# Patient Record
Sex: Male | Born: 1985
Health system: Southern US, Community
[De-identification: ages and names within clinical notes are randomized; demographics above are authoritative.]

## PROBLEM LIST (undated history)

## (undated) DIAGNOSIS — E7800 Pure hypercholesterolemia, unspecified: Secondary | ICD-10-CM

---

## 2012-02-21 ENCOUNTER — Encounter (HOSPITAL_COMMUNITY): Payer: Self-pay

## 2012-02-21 ENCOUNTER — Emergency Department (INDEPENDENT_AMBULATORY_CARE_PROVIDER_SITE_OTHER): Payer: Self-pay

## 2012-02-21 ENCOUNTER — Emergency Department (HOSPITAL_COMMUNITY)
Admission: EM | Admit: 2012-02-21 | Discharge: 2012-02-21 | Disposition: A | Discharge status: Home / Self Care | Payer: Self-pay | Source: Home / Self Care | Attending: Emergency Medicine | Admitting: Emergency Medicine

## 2012-02-21 DIAGNOSIS — S61012A Laceration without foreign body of left thumb without damage to nail, initial encounter: Secondary | ICD-10-CM

## 2012-02-21 DIAGNOSIS — S61209A Unspecified open wound of unspecified finger without damage to nail, initial encounter: Secondary | ICD-10-CM

## 2012-02-21 MED ORDER — LIDOCAINE-EPINEPHRINE 2 %-1:100000 IJ SOLN: 5.0000 mL | Freq: Once | INTRAMUSCULAR | Status: DC

## 2012-02-21 MED ORDER — HYDROCODONE-ACETAMINOPHEN 5-325 MG PO TABS: 2.0000 | ORAL_TABLET | ORAL | Status: AC | PRN

## 2012-02-21 MED ORDER — TETANUS-DIPHTHERIA TOXOIDS TD 5-2 LFU IM INJ
0.5000 mL | INJECTION | Freq: Once | INTRAMUSCULAR | Status: AC
Start: 2012-02-21 — End: 2012-02-21
  Administered 2012-02-21: 0.5 mL via INTRAMUSCULAR

## 2012-02-21 MED ORDER — IBUPROFEN 600 MG PO TABS: 600.0000 mg | ORAL_TABLET | Freq: Four times a day (QID) | ORAL | Status: AC | PRN

## 2012-02-21 MED ORDER — TETANUS-DIPHTH-ACELL PERTUSSIS 5-2.5-18.5 LF-MCG/0.5 IM SUSP
INTRAMUSCULAR | Status: AC
  Filled 2012-02-21: qty 0.5

## 2012-02-21 NOTE — ED Provider Notes (Signed)
History     CSN: [id number]  Arrival date & time 02/21/12  1616   First MD Initiated Contact with Patient 02/21/12 1636      Chief Complaint  Patient presents with  . Laceration    (Consider location/radiation/quality/duration/timing/severity/associated sxs/prior treatment) HPI Comments: Patient is a left-handed male who reports accidentally slicing the dorsal aspect of his left thumb at the proximal phalanx/MC joint. this morning with a clean knife. No nausea, vomiting, foreign body sensation. No weakness, paresthesias, , redness streaking up from the wound. Applied pressure with hemostasis. Patient denies any other injury to his hand. Tetanus unknown.  ROS as noted in HPI. All other ROS negative.   Patient is a 26 y.o. male presenting with hand injury. The history is provided by the patient. No language interpreter was used.  Hand Injury  The incident occurred 6 to 12 hours ago. The incident occurred at home. The injury mechanism was an incision. Pain location: Left thumb. Pertinent negatives include no fever. It is unknown if a foreign body is present.    History reviewed. No pertinent past medical history.  History reviewed. No pertinent past surgical history.  History reviewed. No pertinent family history.  History  Substance Use Topics  . Smoking status: Never Smoker   . Smokeless tobacco: Not on file  . Alcohol Use: No      Review of Systems  Constitutional: Negative for fever.    Allergies  Review of patient's allergies indicates no known allergies.  Home Medications   Current Outpatient Rx  Name Route Sig Dispense Refill  . HYDROCODONE-ACETAMINOPHEN 5-325 MG PO TABS Oral Take 2 tablets by mouth every 4 (four) hours as needed for pain. 20 tablet 0  . IBUPROFEN 600 MG PO TABS Oral Take 1 tablet (600 mg total) by mouth every 6 (six) hours as needed for pain. 30 tablet 0    BP 138/92  Pulse 71  Temp(Src) 99.3 F (37.4 C) (Oral)  Resp 18  SpO2  97%  Physical Exam  Nursing note and vitals reviewed. Constitutional: He is oriented to person, place, and time. He appears well-developed and well-nourished.  HENT:  Head: Normocephalic and atraumatic.  Eyes: Conjunctivae and EOM are normal.  Neck: Normal range of motion.  Cardiovascular: Normal rate.   Pulmonary/Chest: Effort normal. No respiratory distress.  Abdominal: He exhibits no distension.  Musculoskeletal: Normal range of motion.       Hands:      2 x 0.5 cm V-shaped laceration at Base of left  proximal phalanx- see drawing. Patient able to actively move thumb through all range of motion. Thumb opposition intact. Flexion/extension of thumb 5/5 intact against resistance. 2-point discrimination intact. Refill less than 2 seconds.  Neurological: He is alert and oriented to person, place, and time.  Skin: Skin is warm and dry.  Psychiatric: He has a normal mood and affect. His behavior is normal.    ED Course  LACERATION REPAIR Performed by: Luiz Blare Authorized by: Luiz Blare Consent: Verbal consent obtained. Risks and benefits: risks, benefits and alternatives were discussed Consent given by: patient Patient understanding: patient states understanding of the procedure being performed Patient consent: the patient's understanding of the procedure matches consent given Test results: test results available and properly labeled Imaging studies: imaging studies available Required items: required blood products, implants, devices, and special equipment available Patient identity confirmed: verbally with patient Time out: Immediately prior to procedure a "time out" was called to verify the correct patient,  procedure, equipment, support staff and site/side marked as required. Location: Left thumb. Laceration length: 3 cm Foreign bodies: no foreign bodies Tendon involvement: none Nerve involvement: none Vascular damage: no Anesthesia: local infiltration Local  anesthetic: lidocaine 2% with epinephrine Anesthetic total: 5 ml Patient sedated: no Preparation: Patient was prepped and draped in the usual sterile fashion. Irrigation solution: Chlorhexidine, Water. Amount of cleaning: extensive Debridement: none Degree of undermining: minimal Skin closure: 4-0 nylon Subcutaneous closure: 4-0 Vicryl Number of sutures: 7 Technique: horizontal mattress and simple Approximation: close Approximation difficulty: complex Dressing: 4x4 sterile gauze, splint and pressure dressing Patient tolerance: Patient tolerated the procedure well with no immediate complications. Comments: Scrubbed area extensively, explored wound with adequate hemostasis through full range of motion. No foreign body, tendon laceration seen. Joint capsule appears intact. Flexion/extension of thumb 5/5 intact against resistance. 2-point discrimination intact. Refill less than 2 seconds. Applied sterile pressure dressing, placed patient in a thumb spica splint..   (including critical care time)  Labs Reviewed - No data to display Dg Finger Thumb Left  02/21/2012  *RADIOLOGY REPORT*  Clinical Data: Laceration  LEFT THUMB 2+V  Comparison: None.  Findings: No evidence of fracture of the first digit.  No radiodense foreign body.  IMPRESSION: No fracture or foreign body.  Original Report Authenticated By: Genevive Bi, M.D.     1. Laceration of thumb, left, complicated      MDM  X-ray reviewed by myself. Report per radiologist.  Updated patient's tetanus. Will have patient return here in 2 days for a wound recheck. Stitches will come out in 10 days. Discussed this with patient, who agrees with plan.  Luiz Blare, MD 02/22/12 [phone]

## 2012-02-21 NOTE — Discharge Instructions (Signed)
Return here in 2 days for a recheck. Keep your thumb dry, and keep it immobilized in the thumb spica splint. Return to the ER sooner if you have fever above 100.4, if you notice increased pain, redness, swelling coming from the area, or any other concerns.

## 2012-02-21 NOTE — ED Notes (Signed)
Pt cut lt thumb with knife at 0745 today.

## 2012-02-23 ENCOUNTER — Emergency Department (INDEPENDENT_AMBULATORY_CARE_PROVIDER_SITE_OTHER)
Admission: EM | Admit: 2012-02-23 | Discharge: 2012-02-23 | Disposition: A | Discharge status: Home Health | Payer: Self-pay | Source: Home / Self Care | Attending: Family Medicine | Admitting: Family Medicine

## 2012-02-23 ENCOUNTER — Encounter (HOSPITAL_COMMUNITY): Payer: Self-pay | Admitting: *Deleted

## 2012-02-23 DIAGNOSIS — T1490XA Injury, unspecified, initial encounter: Secondary | ICD-10-CM

## 2012-02-23 NOTE — ED Notes (Signed)
Pt returns here from 2 days ago for left thumb laceration  follow-up  Sutures intact and wound healing well

## 2012-02-23 NOTE — Discharge Instructions (Signed)
Change bandage every day, keep dry, return in 1 week for suture removal.

## 2012-02-23 NOTE — ED Provider Notes (Signed)
History     CSN: [id number]  Arrival date & time 02/23/12  4098   First MD Initiated Contact with Patient 02/23/12 [phone]      Chief Complaint  Patient presents with  . Extremity Laceration    (Consider location/radiation/quality/duration/timing/severity/associated sxs/prior treatment) Patient is a 26 y.o. male presenting with wound check. The history is provided by the patient.  Wound Check  He was treated in the ED 2 to 3 days ago. Previous treatment in the ED includes laceration repair. There has been no treatment since the wound repair. There has been no drainage from the wound. There is no redness present. There is no swelling present. The pain has no pain.    History reviewed. No pertinent past medical history.  History reviewed. No pertinent past surgical history.  History reviewed. No pertinent family history.  History  Substance Use Topics  . Smoking status: Never Smoker   . Smokeless tobacco: Not on file  . Alcohol Use: No      Review of Systems  Constitutional: Negative.     Allergies  Review of patient's allergies indicates no known allergies.  Home Medications   Current Outpatient Rx  Name Route Sig Dispense Refill  . HYDROCODONE-ACETAMINOPHEN 5-325 MG PO TABS Oral Take 2 tablets by mouth every 4 (four) hours as needed for pain. 20 tablet 0  . IBUPROFEN 600 MG PO TABS Oral Take 1 tablet (600 mg total) by mouth every 6 (six) hours as needed for pain. 30 tablet 0    BP 114/68  Pulse 76  Temp(Src) 98.1 F (36.7 C) (Oral)  Resp 16  SpO2 98%  Physical Exam  Nursing note and vitals reviewed. Constitutional: He is oriented to person, place, and time. He appears well-developed and well-nourished.  Neurological: He is alert and oriented to person, place, and time.  Skin: Skin is warm and dry.    ED Course  Procedures (including critical care time)  Labs Reviewed - No data to display No results found.   1. Closed Wound       MDM  Sutures  intact, nvt intact, no infection.        Linna Hoff, MD 03/02/12 [phone]

## 2012-03-01 ENCOUNTER — Encounter (HOSPITAL_COMMUNITY): Payer: Self-pay | Admitting: *Deleted

## 2012-03-01 ENCOUNTER — Emergency Department (INDEPENDENT_AMBULATORY_CARE_PROVIDER_SITE_OTHER)
Admission: EM | Admit: 2012-03-01 | Discharge: 2012-03-01 | Disposition: A | Discharge status: Home / Self Care | Payer: Self-pay | Source: Home / Self Care | Attending: Emergency Medicine | Admitting: Emergency Medicine

## 2012-03-01 DIAGNOSIS — S61209A Unspecified open wound of unspecified finger without damage to nail, initial encounter: Secondary | ICD-10-CM

## 2012-03-01 DIAGNOSIS — Z4802 Encounter for removal of sutures: Secondary | ICD-10-CM

## 2012-03-01 DIAGNOSIS — S61012A Laceration without foreign body of left thumb without damage to nail, initial encounter: Secondary | ICD-10-CM

## 2012-03-01 NOTE — Discharge Instructions (Signed)
Wear the thumb spica at all times for the next 5 days. This will help the laceration healed up. The Steri-Strips will come off on their own in about 5-6 days. The wound should be well healed by that point in time. Return if you have a fever above 100.4, if you start having redness at the laceration, pain, pus coming from the wound, or any other concerns.

## 2012-03-01 NOTE — ED Notes (Signed)
Pt   Here  For  Suture   Removal     Placed     In  2  Weeks  Ago  Appears  To be  Well  Healing

## 2012-03-01 NOTE — ED Provider Notes (Signed)
History     CSN: [id number]  Arrival date & time 03/01/12  1620   First MD Initiated Contact with Patient 03/01/12 1706      Chief Complaint  Patient presents with  . Suture / Staple Removal    (Consider location/radiation/quality/duration/timing/severity/associated sxs/prior treatment) HPI Comments: Had sutures placed in the urgent care on 4/15. Seen the next day for a wound check, no infection noted. Patient states that it is healing well. Reports mild pain when he bends his thumb. Has not been wearing his thumb spica as advised. He wore it the first day after the sutures were placed, but not since. Patient currently has no complaints. Has not been needing the pain medication.  Patient is a 26 y.o. male presenting with suture removal. The history is provided by the patient. No language interpreter was used.  Suture / Staple Removal  The sutures were placed 7 to 10 days ago. There has been no treatment since the wound repair. There has been no drainage from the wound. There is no redness present. There is no swelling present. The pain has no pain. He has no difficulty moving the affected extremity or digit.    History reviewed. No pertinent past medical history.  History reviewed. No pertinent past surgical history.  History reviewed. No pertinent family history.  History  Substance Use Topics  . Smoking status: Never Smoker   . Smokeless tobacco: Not on file  . Alcohol Use: No      Review of Systems  Constitutional: Negative for fever.  Gastrointestinal: Negative for nausea and vomiting.  Musculoskeletal: Negative for joint swelling.  Skin: Positive for wound.    Allergies  Review of patient's allergies indicates no known allergies.  Home Medications   Current Outpatient Rx  Name Route Sig Dispense Refill  . HYDROCODONE-ACETAMINOPHEN 5-325 MG PO TABS Oral Take 2 tablets by mouth every 4 (four) hours as needed for pain. 20 tablet 0  . IBUPROFEN 600 MG PO TABS Oral  Take 1 tablet (600 mg total) by mouth every 6 (six) hours as needed for pain. 30 tablet 0    BP 124/77  Pulse 83  Temp(Src) 98.8 F (37.1 C) (Oral)  Resp 16  SpO2 96%  Physical Exam  Nursing note and vitals reviewed. Constitutional: He is oriented to person, place, and time. He appears well-developed and well-nourished.  HENT:  Head: Normocephalic and atraumatic.  Eyes: Conjunctivae and EOM are normal.  Neck: Normal range of motion.  Cardiovascular: Normal rate.   Pulmonary/Chest: Effort normal. No respiratory distress.  Abdominal: He exhibits no distension.  Musculoskeletal: Normal range of motion.       Healing V-shaped laceration dorsal aspect left thumb. No redness, tenderness, purulent drainage. Able to actively move him through full range of motion. Refill less than 2 seconds.  Neurological: He is alert and oriented to person, place, and time.  Skin: Skin is warm and dry.  Psychiatric: He has a normal mood and affect. His behavior is normal.    ED Course  SUTURE REMOVAL Date/Time: 03/01/2012 6:02 PM Performed by: Luiz Blare Authorized by: Luiz Blare Consent: Verbal consent obtained. Risks and benefits: risks, benefits and alternatives were discussed Consent given by: patient Patient understanding: patient states understanding of the procedure being performed Patient consent: the patient's understanding of the procedure matches consent given Sutures Removed: 6 Comments: Pt reports 2 deep and 6 superficial sutures.applied sterile tape.    (including critical care time)  Labs Reviewed - No  data to display No results found.   1. Laceration of thumb, left, complicated   2. Visit for suture removal       MDM  Previous records reviewed. As noted in history of present illness. Patient states that he has not been wearing his thumb spica as directed. Leave that this has slowed the healing. No signs of infection today. Removed all superficial sutures.  Had patient wash hand with chlorhexidine. Placing Steri-Strips, and will have patient start wearing the thumb spica at all times until the Steri-Strips fall off.. Discussed this with patient. Patient to return here as needed. Patient agrees with plan  Luiz Blare, MD 03/01/12 [phone]

## 2015-06-26 ENCOUNTER — Encounter (HOSPITAL_COMMUNITY): Payer: Self-pay | Admitting: *Deleted

## 2015-06-26 ENCOUNTER — Emergency Department (HOSPITAL_COMMUNITY)
Admission: EM | Admit: 2015-06-26 | Discharge: 2015-06-26 | Disposition: A | Discharge status: Home / Self Care | Payer: 59 | Attending: Emergency Medicine | Admitting: Emergency Medicine

## 2015-06-26 DIAGNOSIS — Z7982 Long term (current) use of aspirin: Secondary | ICD-10-CM | POA: Insufficient documentation

## 2015-06-26 DIAGNOSIS — R040 Epistaxis: Secondary | ICD-10-CM | POA: Diagnosis not present

## 2015-06-26 DIAGNOSIS — R509 Fever, unspecified: Secondary | ICD-10-CM | POA: Diagnosis present

## 2015-06-26 DIAGNOSIS — R740 Nonspecific elevation of levels of transaminase and lactic acid dehydrogenase [LDH]: Secondary | ICD-10-CM

## 2015-06-26 DIAGNOSIS — D696 Thrombocytopenia, unspecified: Secondary | ICD-10-CM | POA: Insufficient documentation

## 2015-06-26 DIAGNOSIS — R109 Unspecified abdominal pain: Secondary | ICD-10-CM | POA: Diagnosis not present

## 2015-06-26 DIAGNOSIS — R5383 Other fatigue: Secondary | ICD-10-CM | POA: Insufficient documentation

## 2015-06-26 DIAGNOSIS — R7401 Elevation of levels of liver transaminase levels: Secondary | ICD-10-CM

## 2015-06-26 LAB — CBC
HEMATOCRIT: 45.7 % (ref 39.0–52.0)
HEMOGLOBIN: 15.6 g/dL (ref 13.0–17.0)
MCH: 26.7 pg (ref 26.0–34.0)
MCHC: 34.1 g/dL (ref 30.0–36.0)
MCV: 78.1 fL (ref 78.0–100.0)
Platelets: 121 10*3/uL — ABNORMAL LOW (ref 150–400)
RBC: 5.85 MIL/uL — AB (ref 4.22–5.81)
RDW: 12.3 % (ref 11.5–15.5)
WBC: 6.7 10*3/uL (ref 4.0–10.5)

## 2015-06-26 LAB — COMPREHENSIVE METABOLIC PANEL
ALBUMIN: 3.9 g/dL (ref 3.5–5.0)
ALK PHOS: 168 U/L — AB (ref 38–126)
ALT: 93 U/L — AB (ref 17–63)
AST: 89 U/L — ABNORMAL HIGH (ref 15–41)
Anion gap: 11 (ref 5–15)
BILIRUBIN TOTAL: 0.7 mg/dL (ref 0.3–1.2)
BUN: 13 mg/dL (ref 6–20)
CALCIUM: 9 mg/dL (ref 8.9–10.3)
CO2: 21 mmol/L — AB (ref 22–32)
CREATININE: 0.74 mg/dL (ref 0.61–1.24)
Chloride: 106 mmol/L (ref 101–111)
GFR calc Af Amer: >60 mL/min (ref >60)
GFR calc non Af Amer: >60 mL/min (ref >60)
GLUCOSE: 114 mg/dL — AB (ref 65–99)
Potassium: 3.4 mmol/L — ABNORMAL LOW (ref 3.5–5.1)
SODIUM: 138 mmol/L (ref 135–145)
Total Protein: 7.9 g/dL (ref 6.5–8.1)

## 2015-06-26 LAB — URINALYSIS, ROUTINE W REFLEX MICROSCOPIC
Glucose, UA: NEGATIVE mg/dL
Ketones, ur: >80 mg/dL — AB
LEUKOCYTES UA: NEGATIVE
NITRITE: NEGATIVE
PROTEIN: 30 mg/dL — AB
Specific Gravity, Urine: 1.035 — ABNORMAL HIGH (ref 1.005–1.030)
UROBILINOGEN UA: 0.2 mg/dL (ref 0.0–1.0)
pH: 5.5 (ref 5.0–8.0)

## 2015-06-26 LAB — URINE MICROSCOPIC-ADD ON

## 2015-06-26 LAB — I-STAT CG4 LACTIC ACID, ED: Lactic Acid, Venous: 1.06 mmol/L (ref 0.5–2.0)

## 2015-06-26 LAB — PROTIME-INR
INR: 1.06 (ref 0.00–1.49)
Prothrombin Time: 14 seconds (ref 11.6–15.2)

## 2015-06-26 MED ORDER — SODIUM CHLORIDE 0.9 % IV BOLUS (SEPSIS)
1000.0000 mL | Freq: Once | INTRAVENOUS | Status: AC
  Administered 2015-06-26: 1000 mL via INTRAVENOUS

## 2015-06-26 MED ORDER — ACETAMINOPHEN 325 MG PO TABS
ORAL_TABLET | ORAL | Status: AC
  Filled 2015-06-26: qty 2

## 2015-06-26 MED ORDER — ACETAMINOPHEN 325 MG PO TABS
650.0000 mg | ORAL_TABLET | Freq: Once | ORAL | Status: AC | PRN
  Administered 2015-06-26: 650 mg via ORAL

## 2015-06-26 NOTE — ED Notes (Signed)
PA at bedside.

## 2015-06-26 NOTE — Discharge Instructions (Signed)
Please read and follow all provided instructions.  Your diagnoses today include:  1. Fever, unspecified fever cause   2. Epistaxis   3. Thrombocytopenia   4. Transaminitis     Tests performed today include:  Blood counts and electrolytes - shows low platelets and elevated liver function tests, these will need to be rechecked by your doctor  Urine test - shows dehydration  Vital signs. See below for your results today.   Medications prescribed:   None  Take any prescribed medications only as directed.  Home care instructions:  Follow any educational materials contained in this packet.  BE VERY CAREFUL not to take multiple medicines containing Tylenol (also called acetaminophen). Doing so can lead to an overdose which can damage your liver and cause liver failure and possibly death.   Follow-up instructions: Please follow-up with your primary care provider in the next 7 days for further evaluation of your symptoms.   Return instructions:   Please return to the Emergency Department if you experience worsening symptoms.   Return with pain, vomiting, high persistent fever, color change of skin  Please return if you have any other emergent concerns.  Additional Information:  Your vital signs today were: BP 100/63 mmHg   Pulse 99   Temp(Src) 98.8 F (37.1 C) (Oral)   Resp 18   Ht  (1.6 m)   Wt 142 lb 1.6 oz (64.456 kg)   BMI 25.18 kg/m2   SpO2 99% If your blood pressure (BP) was elevated above 135/85 this visit, please have this repeated by your doctor within one month. --------------

## 2015-06-26 NOTE — ED Notes (Signed)
Pt reports fever and left leg pain since Saturday. Pt reports OTC meds without relief. Pt reports nosebleed that started this morning from rt nare.

## 2015-06-26 NOTE — ED Provider Notes (Signed)
CSN: [id number]     Arrival date & time 06/26/15  1914 History   First MD Initiated Contact with Patient 06/26/15 1127     Chief Complaint  Patient presents with  . Epistaxis  . Fever     (Consider location/radiation/quality/duration/timing/severity/associated sxs/prior Treatment) HPI Comments: Patient was born in [place], currently resides in the [place], no recent travel -- presents with 1 day of fever, body aches, fatigue and nosebleed from right nare. Patient has been using ibuprofen without relief. Patient had a mild headache yesterday but no significant neck pain. No ear pain or runny nose, sore throat, cough. Patient does have some generalized mild abdominal pain. No urinary symptoms or change in his urine color. No skin rashes or recent tick bites. No known sick contacts. Patient does not use any daily medications. Patient states that he has a sister with hepatitis B but he has never been diagnosed with any form of liver disease. Denies IV drug use. Denies ingestions of raw or undercooked meats. The onset of this condition was acute. The course is constant. Aggravating factors: none. Alleviating factors: none.    Patient is a 29 y.o. male presenting with nosebleeds and fever. The history is provided by the patient.  Epistaxis Associated symptoms: fever   Associated symptoms: no congestion, no cough, no headaches and no sore throat   Fever Associated symptoms: no chills, no congestion, no cough, no diarrhea, no dysuria, no ear pain, no headaches, no myalgias, no nausea, no rash, no rhinorrhea, no sore throat and no vomiting     History reviewed. No pertinent past medical history. History reviewed. No pertinent past surgical history. No family history on file. Social History  Substance Use Topics  . Smoking status: Never Smoker   . Smokeless tobacco: None  . Alcohol Use: No    Review of Systems  Constitutional: Positive for fever and fatigue. Negative for chills.  HENT:  Positive for nosebleeds. Negative for congestion, ear pain, rhinorrhea, sinus pressure and sore throat.   Eyes: Negative for redness.  Respiratory: Negative for cough and wheezing.   Gastrointestinal: Positive for abdominal pain. Negative for nausea, vomiting and diarrhea.  Genitourinary: Negative for dysuria.  Musculoskeletal: Negative for myalgias and neck stiffness.  Skin: Negative for rash.  Neurological: Negative for headaches.  Hematological: Negative for adenopathy.    Allergies  Review of patient's allergies indicates no known allergies.  Home Medications   Prior to Admission medications   Medication Sig Start Date End Date Taking? Authorizing Provider  aspirin 325 MG tablet Take 325 mg by mouth every 6 (six) hours as needed for mild pain.   Yes Historical Provider, MD   BP 127/71 mmHg  Pulse 100  Temp(Src) 100.2 F (37.9 C) (Oral)  Resp 18  Ht  (1.6 m)  Wt 142 lb 1.6 oz (64.456 kg)  BMI 25.18 kg/m2  SpO2 99%   Physical Exam  Constitutional: He appears well-developed and well-nourished.  HENT:  Head: Normocephalic and atraumatic.  Right Ear: Tympanic membrane, external ear and ear canal normal.  Left Ear: Tympanic membrane, external ear and ear canal normal.  Nose: No mucosal edema or rhinorrhea. Epistaxis (R anterior septum, no current bleeding) is observed.  Mouth/Throat: Uvula is midline, oropharynx is clear and moist and mucous membranes are normal. Mucous membranes are not dry. No trismus in the jaw. No uvula swelling. No oropharyngeal exudate, posterior oropharyngeal edema, posterior oropharyngeal erythema or tonsillar abscesses.  Eyes: Conjunctivae are normal. Right eye exhibits no  discharge. Left eye exhibits no discharge.  Neck: Normal range of motion. Neck supple.  Cardiovascular: Normal rate, regular rhythm and normal heart sounds.   No murmur heard. Pulmonary/Chest: Effort normal and breath sounds normal. No respiratory distress. He has no wheezes.  He has no rales.  Abdominal: Soft. Bowel sounds are normal. He exhibits no distension. There is no hepatomegaly. There is tenderness (mild, non-focal tenderness). There is no rebound and no guarding.  Neurological: He is alert.  Skin: Skin is warm and dry.  Psychiatric: He has a normal mood and affect.  Nursing note and vitals reviewed.   ED Course  Procedures (including critical care time) Labs Review Labs Reviewed  COMPREHENSIVE METABOLIC PANEL - Abnormal; Notable for the following:    Potassium 3.4 (*)    CO2 21 (*)    Glucose, Bld 114 (*)    AST 89 (*)    ALT 93 (*)    Alkaline Phosphatase 168 (*)    All other components within normal limits  CBC - Abnormal; Notable for the following:    RBC 5.85 (*)    Platelets 121 (*)    All other components within normal limits  URINALYSIS, ROUTINE W REFLEX MICROSCOPIC (NOT AT [facility]) - Abnormal; Notable for the following:    Specific Gravity, Urine 1.035 (*)    Hgb urine dipstick TRACE (*)    Bilirubin Urine SMALL (*)    Ketones, ur >80 (*)    Protein, ur 30 (*)    All other components within normal limits  URINE MICROSCOPIC-ADD ON - Abnormal; Notable for the following:    Squamous Epithelial / LPF FEW (*)    Bacteria, UA FEW (*)    All other components within normal limits  URINE CULTURE  PROTIME-INR  I-STAT CG4 LACTIC ACID, ED    Imaging Review No results found. I have personally reviewed and evaluated these images and lab results as part of my medical decision-making.   EKG Interpretation None       11:54 AM Patient seen and examined. Work-up initiated. Fluids ordered. Patient does not appear ill. Exam is overall benign. Informed of elevated liver tests and low platelets. Will need these followed by PCP. INR sent given elevated LFTs.   Vital signs reviewed and are as follows: BP 127/71 mmHg  Pulse 100  Temp(Src) 100.2 F (37.9 C) (Oral)  Resp 18  Ht 5\' 3"  (1.6 m)  Wt 142 lb 1.6 oz (64.456 kg)  BMI 25.18 kg/m2   SpO2 99%  3:50 PM Patient's UA shows ketones and high specific gravity. Patient was held in ED until additional liter of fluids given. This bleed remains controlled.  Patient continues to appear well. Fever improved with Tylenol. Will discharge to home with conservative measures, PCP referral. Patient encouraged to return to emergency department with worsening symptoms, high persistent fever, worsening abdominal pain, skin color change, or other concerns.  MDM   Final diagnoses:  Fever, unspecified fever cause  Epistaxis  Thrombocytopenia  Transaminitis   Patient with fever of unclear etiology 24 hours. Lactate is normal. Vital signs are within normal limits. Heart rate improved with improvement and fever. Incidental thrombocytopenia and transaminitis. Pt does not have any focal abdominal pain to suggest cholecystitis, hepatitis, appendicitis. No hyperbilirubinemia. Patient appears very well. He was hydrated in emergency department. Feel remainder of workup can be done as an outpatient. Discussed appropriate return instructions with patient.    Renne Crigler, PA-C 06/26/15 1553  Donnetta Hutching, MD 06/27/15 [phone]

## 2015-06-28 LAB — URINE CULTURE

## 2015-06-29 ENCOUNTER — Telehealth (HOSPITAL_BASED_OUTPATIENT_CLINIC_OR_DEPARTMENT_OTHER): Payer: Self-pay | Admitting: Emergency Medicine

## 2015-06-29 NOTE — Telephone Encounter (Signed)
Post ED Visit - Positive Culture Follow-up  Culture report reviewed by antimicrobial stewardship pharmacist:  Wes Dulaney, Pharm.D., BCPS  Celedonio Miyamoto, [address].D., BCPS  Georgina Pillion, Pharm.D., BCPS  [place], [address].D., BCPS, AAHIVP  Estella Husk, Pharm.D., BCPS, AAHIVP  Elder Cyphers, [address].D., BCPS Okey Regal PharmD  Positive urine culture multiple species Asymptomatic and no further patient follow-up is required at this time.  Berle Mull 06/29/2015, 9:40 AM

## 2016-12-31 ENCOUNTER — Ambulatory Visit (HOSPITAL_COMMUNITY)
Admission: EM | Admit: 2016-12-31 | Discharge: 2016-12-31 | Disposition: A | Discharge status: Home / Self Care | Payer: BLUE CROSS/BLUE SHIELD | Attending: Family Medicine | Admitting: Family Medicine

## 2016-12-31 ENCOUNTER — Encounter (HOSPITAL_COMMUNITY): Payer: Self-pay | Admitting: Emergency Medicine

## 2016-12-31 DIAGNOSIS — R059 Cough, unspecified: Secondary | ICD-10-CM

## 2016-12-31 DIAGNOSIS — R05 Cough: Secondary | ICD-10-CM

## 2016-12-31 DIAGNOSIS — R6889 Other general symptoms and signs: Secondary | ICD-10-CM | POA: Diagnosis not present

## 2016-12-31 MED ORDER — ACETAMINOPHEN 325 MG PO TABS
ORAL_TABLET | ORAL | Status: AC
  Filled 2016-12-31: qty 3

## 2016-12-31 MED ORDER — OSELTAMIVIR PHOSPHATE 75 MG PO CAPS: 75.0000 mg | ORAL_CAPSULE | Freq: Two times a day (BID) | ORAL | 0 refills | Status: DC

## 2016-12-31 MED ORDER — BENZONATATE 100 MG PO CAPS: 100.0000 mg | ORAL_CAPSULE | Freq: Three times a day (TID) | ORAL | 0 refills | Status: DC

## 2016-12-31 MED ORDER — ACETAMINOPHEN 325 MG PO TABS
650.0000 mg | ORAL_TABLET | Freq: Once | ORAL | Status: AC
  Administered 2016-12-31: 650 mg via ORAL

## 2016-12-31 NOTE — ED Provider Notes (Signed)
CSN: [id number]     Arrival date & time 12/31/16  1655 History   None    Chief Complaint  Patient presents with  . Cough   (Consider location/radiation/quality/duration/timing/severity/associated sxs/prior Treatment) Patient c/o URI sx's and fever.  Patient c/o arthralgias and myalgias.   The history is provided by the patient.  Cough  Cough characteristics:  Non-productive Sputum characteristics:  Nondescript Onset quality:  Sudden Duration:  1 day Timing:  Constant Progression:  Worsening Chronicity:  New Smoker: no   Relieved by:  Nothing Worsened by:  Nothing Ineffective treatments:  None tried Associated symptoms: fever and rhinorrhea     History reviewed. No pertinent past medical history. History reviewed. No pertinent surgical history. No family history on file. Social History  Substance Use Topics  . Smoking status: Never Smoker  . Smokeless tobacco: Not on file  . Alcohol use No    Review of Systems  Constitutional: Positive for fatigue and fever.  HENT: Positive for rhinorrhea.   Eyes: Negative.   Respiratory: Positive for cough.   Cardiovascular: Negative.   Gastrointestinal: Negative.   Endocrine: Negative.   Genitourinary: Negative.   Musculoskeletal: Negative.   Allergic/Immunologic: Negative.   Neurological: Negative.   Hematological: Negative.   Psychiatric/Behavioral: Negative.     Allergies  Patient has no known allergies.  Home Medications   Prior to Admission medications   Medication Sig Start Date End Date Taking? Authorizing Provider  guaiFENesin (MUCINEX) 600 MG 12 hr tablet Take by mouth 2 (two) times daily.   Yes Historical Provider, MD  aspirin 325 MG tablet Take 325 mg by mouth every 6 (six) hours as needed for mild pain.    Historical Provider, MD  benzonatate (TESSALON) 100 MG capsule Take 1 capsule (100 mg total) by mouth every 8 (eight) hours. 12/31/16   Deatra CanterWilliam J Hortencia Martire, FNP  oseltamivir (TAMIFLU) 75 MG capsule Take 1  capsule (75 mg total) by mouth every 12 (twelve) hours. 12/31/16   Deatra CanterWilliam J Ebrima Ranta, FNP   Meds Ordered and Administered this Visit  Medications - No data to display  There were no vitals taken for this visit. No data found.   Physical Exam  Constitutional: He appears well-developed and well-nourished.  HENT:  Head: Normocephalic and atraumatic.  Right Ear: External ear normal.  Left Ear: External ear normal.  Mouth/Throat: Oropharynx is clear and moist.  Eyes: Conjunctivae and EOM are normal. Pupils are equal, round, and reactive to light.  Neck: Normal range of motion. Neck supple.  Cardiovascular: Normal rate, regular rhythm and normal heart sounds.   Pulmonary/Chest: Effort normal and breath sounds normal.  Abdominal: Soft. Bowel sounds are normal.  Nursing note and vitals reviewed.   Urgent Care Course     Procedures (including critical care time)  Labs Review Labs Reviewed - No data to display  Imaging Review No results found.   Visual Acuity Review  Right Eye Distance:   Left Eye Distance:   Bilateral Distance:    Right Eye Near:   Left Eye Near:    Bilateral Near:         MDM   1. Flu-like symptoms   2. Cough    Tamiflu Tessalon Perles  Push po fluids, rest, tylenol and motrin otc prn as directed for fever, arthralgias, and myalgias.  Follow up prn if sx's continue or persist.    Deatra CanterWilliam J Sheral Pfahler, FNP 12/31/16 [phone]

## 2016-12-31 NOTE — ED Triage Notes (Signed)
Stuffy nose, cough, headache, chills-onset 2 days ago

## 2017-01-04 DIAGNOSIS — D72829 Elevated white blood cell count, unspecified: Secondary | ICD-10-CM | POA: Diagnosis not present

## 2017-01-04 DIAGNOSIS — Z0000 Encounter for general adult medical examination without abnormal findings: Secondary | ICD-10-CM | POA: Diagnosis not present

## 2017-01-04 DIAGNOSIS — Z13220 Encounter for screening for lipoid disorders: Secondary | ICD-10-CM | POA: Diagnosis not present

## 2017-01-11 DIAGNOSIS — K529 Noninfective gastroenteritis and colitis, unspecified: Secondary | ICD-10-CM | POA: Diagnosis not present

## 2018-11-12 ENCOUNTER — Other Ambulatory Visit: Payer: Self-pay

## 2018-11-12 ENCOUNTER — Ambulatory Visit (INDEPENDENT_AMBULATORY_CARE_PROVIDER_SITE_OTHER): Payer: Self-pay

## 2018-11-12 ENCOUNTER — Encounter (HOSPITAL_COMMUNITY): Payer: Self-pay

## 2018-11-12 ENCOUNTER — Ambulatory Visit (HOSPITAL_COMMUNITY)
Admission: EM | Admit: 2018-11-12 | Discharge: 2018-11-12 | Disposition: A | Discharge status: Home / Self Care | Payer: Self-pay | Attending: Family Medicine | Admitting: Family Medicine

## 2018-11-12 DIAGNOSIS — S99912A Unspecified injury of left ankle, initial encounter: Secondary | ICD-10-CM | POA: Diagnosis not present

## 2018-11-12 DIAGNOSIS — M7989 Other specified soft tissue disorders: Secondary | ICD-10-CM | POA: Diagnosis not present

## 2018-11-12 DIAGNOSIS — S93492A Sprain of other ligament of left ankle, initial encounter: Secondary | ICD-10-CM | POA: Insufficient documentation

## 2018-11-12 DIAGNOSIS — M25572 Pain in left ankle and joints of left foot: Secondary | ICD-10-CM

## 2018-11-12 MED ORDER — IBUPROFEN 800 MG PO TABS: 800.0000 mg | ORAL_TABLET | Freq: Three times a day (TID) | ORAL | 0 refills | Status: DC

## 2018-11-12 NOTE — ED Provider Notes (Signed)
[facility]   [id number] 11/12/18 Arrival Time: 1218  ASSESSMENT & PLAN:  1. Sprain of anterior talofibular ligament of left ankle, initial encounter    I have personally viewed the imaging studies ordered this visit. No ankle fracture seen. Discussed.  Imaging: Dg Ankle Complete Left  Result Date: 11/12/2018 CLINICAL DATA:  Injury EXAM: LEFT ANKLE COMPLETE - 3+ VIEW COMPARISON:  None. FINDINGS: There is soft tissue swelling over the lateral malleolus. No acute fracture or dislocation. IMPRESSION: No acute bony injury. Soft tissue swelling over the lateral malleolus is noted. Electronically Signed   By: Jolaine Click M.D.   On: 11/12/2018 14:35   Meds ordered this encounter  Medications  . ibuprofen (ADVIL,MOTRIN) 800 MG tablet    Sig: Take 1 tablet (800 mg total) by mouth 3 (three) times daily.    Dispense:  21 tablet    Refill:  0   Orders Placed This Encounter  Procedures  . Apply ASO ankle    Follow-up Information    [facility].   Specialty:  Urgent Care Why:  If not improving over the next week. Contact information: [address] [place] [address] [phone]         Rest the injured area as much as practical.  Natural history and expected course discussed. Questions answered. Rest, ice, compression, elevation (RICE) therapy. Transport planner distributed. Fit with ankle brace for use over next 1-2 weeks. NSAIDs per medication orders. WBAT. Declines crutches.  Reviewed expectations re: course of current medical issues. Questions answered. Outlined signs and symptoms indicating need for more acute intervention. Patient verbalized understanding. After Visit Summary given.  SUBJECTIVE: History from: patient. Andrew Cardenas is a 33 y.o. male who reports persistent mild to moderate pain of his left lateral ankle; described as aching without radiation. Onset: abrupt, a week ago. Injury/trama:  yes, reports twisting ankle while hunting; able to bear weight immediately and since with discomfort. Able to wear his normal shoes. Symptoms have progressed to a point and plateaued since beginning. Aggravating factors: weight bearing and certain movements. Alleviating factors: rest. Associated symptoms: none reported. Extremity sensation changes or weakness: none. Self treatment: has not tried OTCs for relief of pain. History of similar: no.  History reviewed. No pertinent surgical history.   ROS: As per HPI. All other systems negative.    OBJECTIVE:  Vitals:   11/12/18 1406 11/12/18 1407  BP:  131/70  Pulse:  78  Resp:  18  Temp:  98 F (36.7 C)  TempSrc:  Oral  SpO2:  100%  Weight: 71.1 kg     General appearance: alert; no distress Head without signs of trauma. Extremities: . LLE: warm and well perfused; poorly localized moderate tenderness over left lateral ankle (ATFL distribution and over posterior malleolus); without gross deformities; with mild swelling; with no bruising; ROM: normal but with discomfort CV: brisk extremity capillary refill of LLE; 2+ DP and PT pulse of LLE. Skin: warm and dry; no visible rashes Neurologic: gait normal but favors LLE; normal reflexes of RLE and LLE; normal sensation of RLE and LLE; normal strength of RLE and LLE Psychological: alert and cooperative; normal mood and affect  No Known Allergies  PMH: Left thumb laceration.  Social History   Socioeconomic History  . Marital status: Married    Spouse name: Not on file  . Number of children: Not on file  . Years of education: Not on file  . Highest education  level: Not on file  Occupational History  . Not on file  Social Needs  . Financial resource strain: Not on file  . Food insecurity:    Worry: Not on file    Inability: Not on file  . Transportation needs:    Medical: Not on file    Non-medical: Not on file  Tobacco Use  . Smoking status: Never Smoker  . Smokeless  tobacco: Never Used  Substance and Sexual Activity  . Alcohol use: No  . Drug use: No  . Sexual activity: Not on file  Lifestyle  . Physical activity:    Days per week: Not on file    Minutes per session: Not on file  . Stress: Not on file  Relationships  . Social connections:    Talks on phone: Not on file    Gets together: Not on file    Attends religious service: Not on file    Active member of club or organization: Not on file    Attends meetings of clubs or organizations: Not on file    Relationship status: Not on file  Other Topics Concern  . Not on file  Social History Narrative  . Not on file   FH: Unknown.  History reviewed. No pertinent surgical history.    Mardella LaymanHagler, Genova Kiner, MD 11/14/18 [phone]

## 2018-11-12 NOTE — ED Triage Notes (Signed)
Pt cc left pain pt states he was hunting and he stepped on a rock and twisted his ankle. 1 week ago.

## 2018-11-12 NOTE — Discharge Instructions (Signed)
You have been diagnosed with an ankle sprain today. Please wear the ankle brace provided for the next week. If crutches were provided, please use them to remain non weight bearing for the next 2 days. After this, you may gradually begin to bear weight as tolerated. If possible, elevate your ankle when seated. Applying ice for 20 minutes at a time every hour as needed may help with any pain for swelling you may have. You may also  take Ibuprofen 3 times daily for pain and inflammation. Follow up with your doctor or an orthopaedist in 1 week if you are not seeing significant improvement. °

## 2019-10-28 IMAGING — DX DG ANKLE COMPLETE 3+V*L*
3 series · 3 of 3 positions shown · non-contrast
Comparison: None.

CLINICAL DATA: Injury

EXAM:
LEFT ANKLE COMPLETE - 3+ VIEW

[ankle ap]
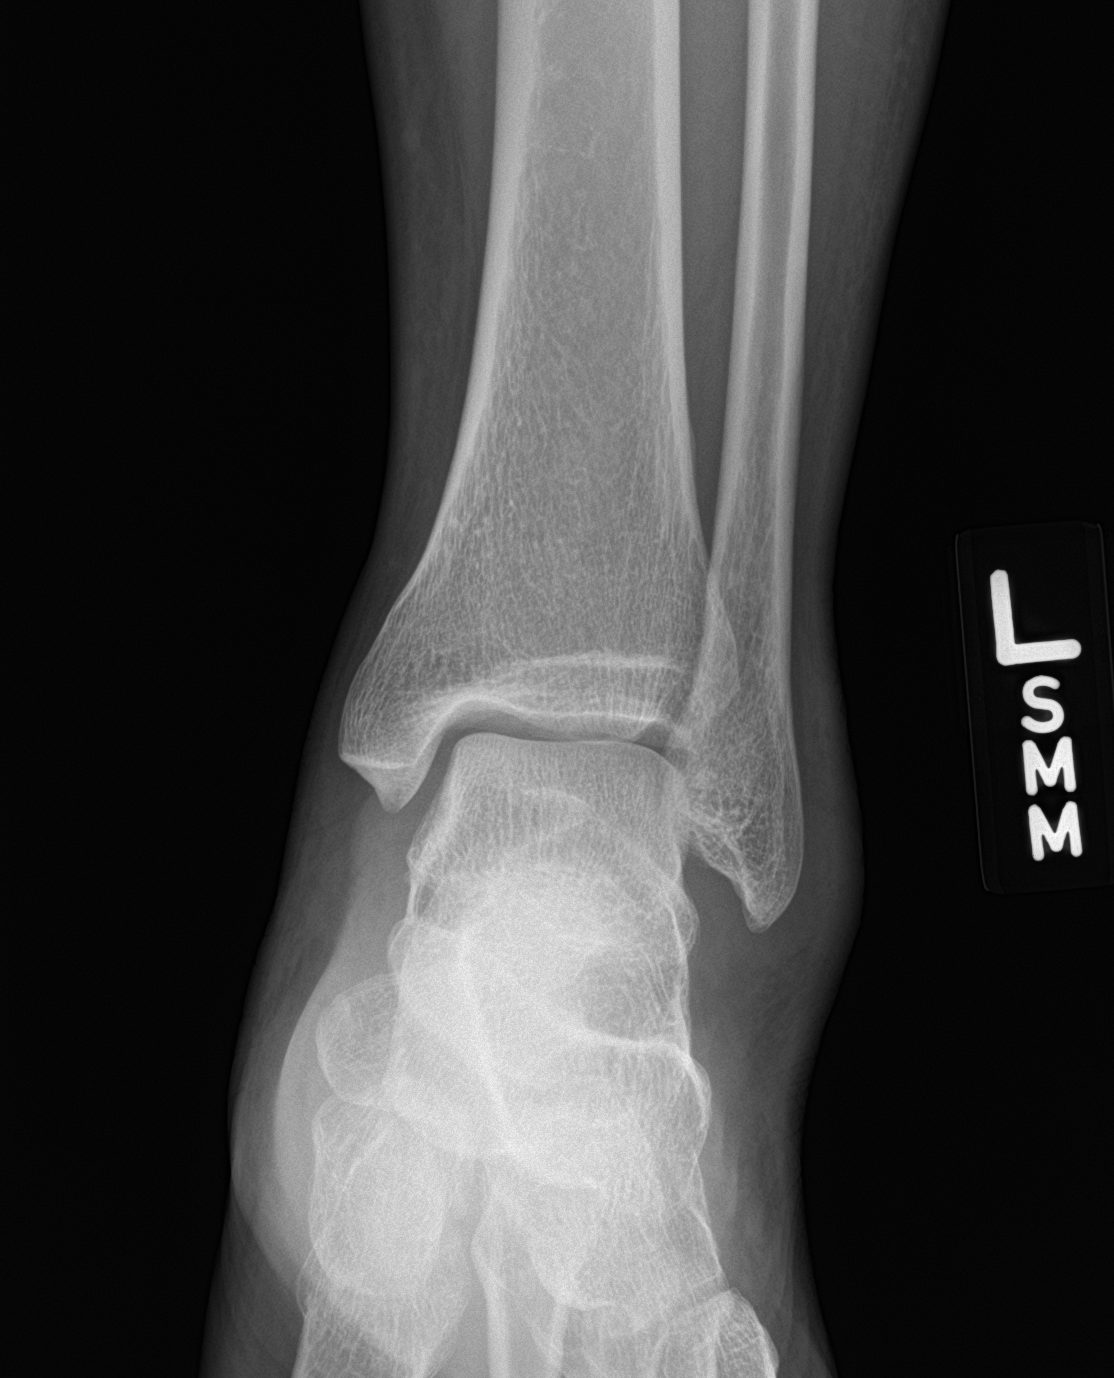

[ankle obl]
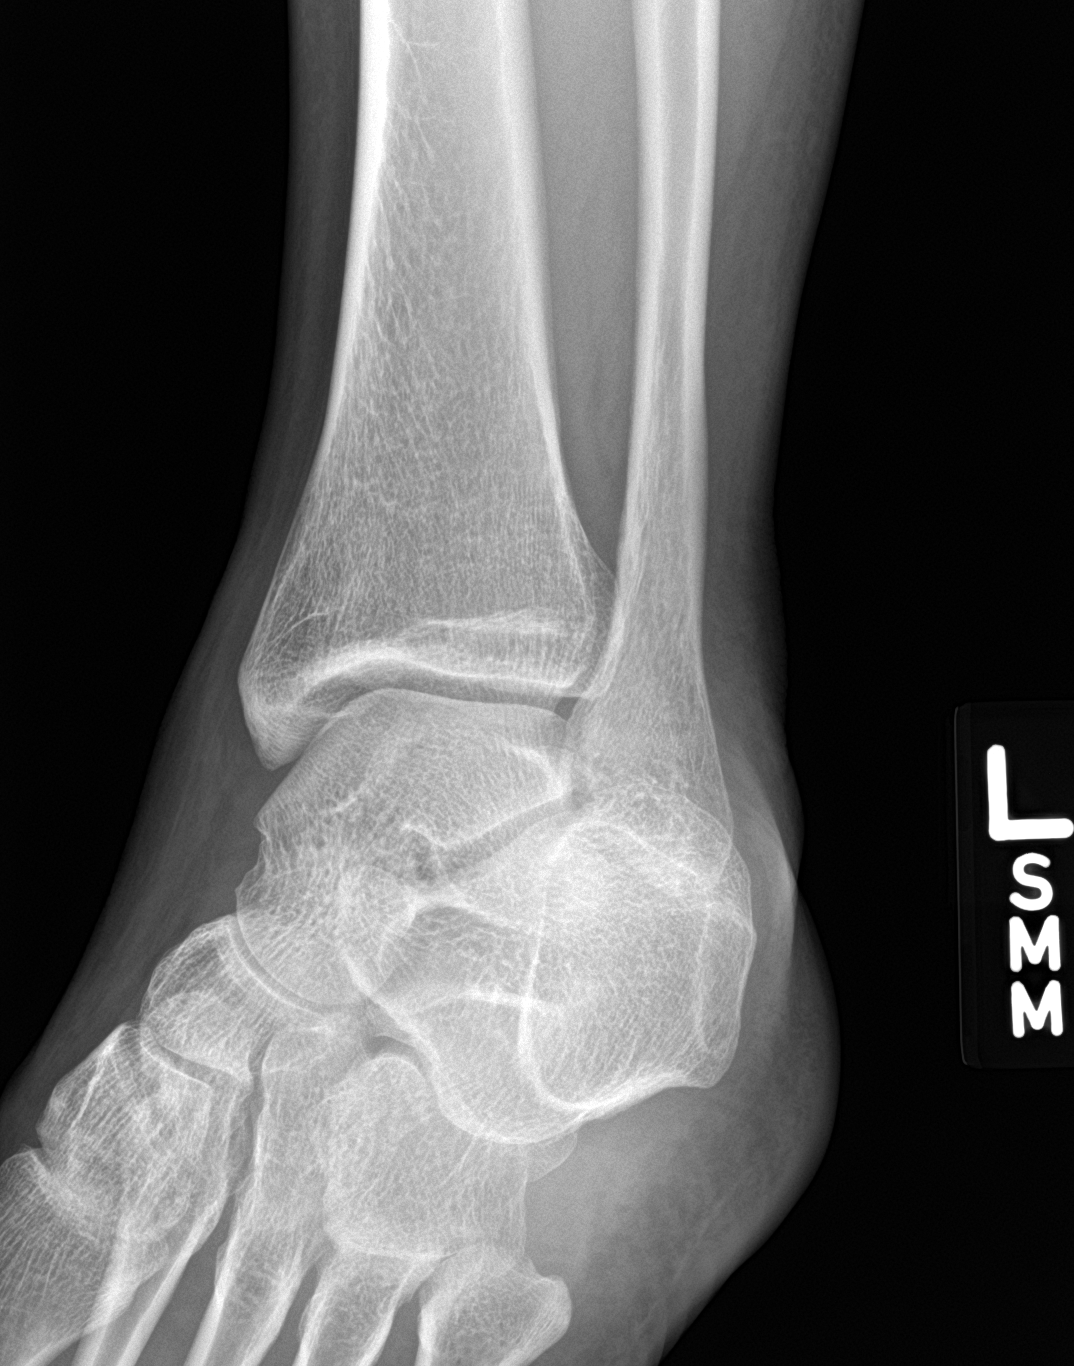

[ankle lat]
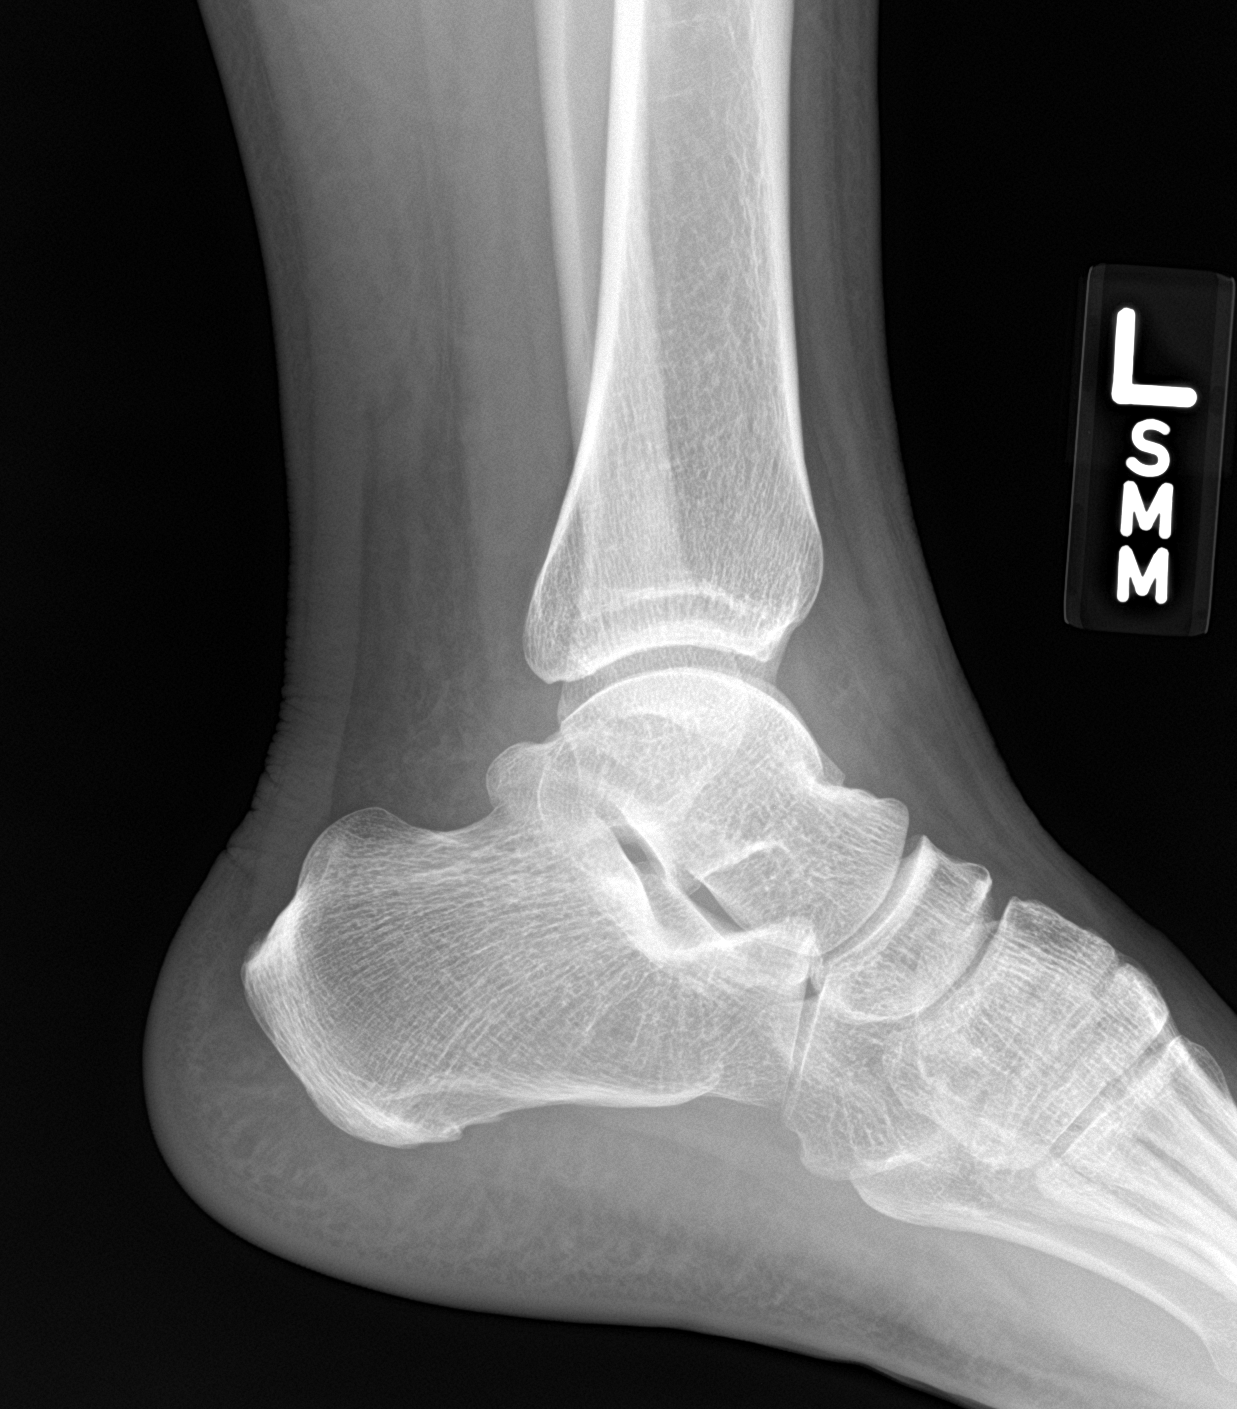

[3 of 3 positions shown; findings below may reference images not displayed]

FINDINGS: There is soft tissue swelling over the lateral malleolus. No acute
fracture or dislocation.
IMPRESSION: No acute bony injury. Soft tissue swelling over the lateral
malleolus is noted.

## 2020-08-13 ENCOUNTER — Ambulatory Visit (INDEPENDENT_AMBULATORY_CARE_PROVIDER_SITE_OTHER): Payer: BC Managed Care – PPO | Admitting: Primary Care

## 2020-08-13 ENCOUNTER — Encounter (INDEPENDENT_AMBULATORY_CARE_PROVIDER_SITE_OTHER): Payer: Self-pay | Admitting: Primary Care

## 2020-08-13 ENCOUNTER — Other Ambulatory Visit: Payer: Self-pay

## 2020-08-13 VITALS — BP 123/74 | HR 63 | Temp 97.3°F | Ht 64.0 in | Wt 154.4 lb

## 2020-08-13 DIAGNOSIS — Z114 Encounter for screening for human immunodeficiency virus [HIV]: Secondary | ICD-10-CM

## 2020-08-13 DIAGNOSIS — Z7689 Persons encountering health services in other specified circumstances: Secondary | ICD-10-CM | POA: Diagnosis not present

## 2020-08-13 DIAGNOSIS — Z13220 Encounter for screening for lipoid disorders: Secondary | ICD-10-CM

## 2020-08-13 DIAGNOSIS — Z23 Encounter for immunization: Secondary | ICD-10-CM

## 2020-08-13 DIAGNOSIS — Z1159 Encounter for screening for other viral diseases: Secondary | ICD-10-CM

## 2020-08-13 DIAGNOSIS — R351 Nocturia: Secondary | ICD-10-CM | POA: Diagnosis not present

## 2020-08-13 DIAGNOSIS — Z833 Family history of diabetes mellitus: Secondary | ICD-10-CM

## 2020-08-13 LAB — POCT GLYCOSYLATED HEMOGLOBIN (HGB A1C): Hemoglobin A1C: 5.4 % (ref 4.0–5.6)

## 2020-08-13 NOTE — Progress Notes (Signed)
New Patient Office Visit  Subjective:  Patient ID: Manas Hickling, male    DOB: [birth year]  Age: 34 y.o. MRN: [id number]  CC:  Chief Complaint  Patient presents with  . New Patient (Initial Visit)    HPI Mr. Kerim Statzer is a 34 year old [place] male which speaks fluent [place] and [place] Can is his native language presents for establishment of care he voices no complaints or concerns.  Blood pressure is unremarkable.  He has not eaten today will obtain labs       No family history on file.  Social History   Socioeconomic History  . Marital status: Married    Spouse name: Not on file  . Number of children: Not on file  . Years of education: Not on file  . Highest education level: Not on file  Occupational History  . Not on file  Tobacco Use  . Smoking status: Never Smoker  . Smokeless tobacco: Never Used  Substance and Sexual Activity  . Alcohol use: No  . Drug use: No  . Sexual activity: Not on file  Other Topics Concern  . Not on file  Social History Narrative  . Not on file   Social Determinants of Health   Financial Resource Strain:   . Difficulty of Paying Living Expenses: Not on file  Food Insecurity:   . Worried About Charity fundraiser in the Last Year: Not on file  . Ran Out of Food in the Last Year: Not on file  Transportation Needs:   . Lack of Transportation (Medical): Not on file  . Lack of Transportation (Non-Medical): Not on file  Physical Activity:   . Days of Exercise per Week: Not on file  . Minutes of Exercise per Session: Not on file  Stress:   . Feeling of Stress : Not on file  Social Connections:   . Frequency of Communication with Friends and Family: Not on file  . Frequency of Social Gatherings with Friends and Family: Not on file  . Attends Religious Services: Not on file  . Active Member of Clubs or Organizations: Not on file  . Attends Archivist Meetings: Not on file  . Marital Status: Not on file  Intimate Partner Violence:    . Fear of Current or Ex-Partner: Not on file  . Emotionally Abused: Not on file  . Physically Abused: Not on file  . Sexually Abused: Not on file    ROS Review of Systems  HENT: Negative for voice change.   All other systems reviewed and are negative.   Objective:   Today's Vitals: BP 123/74 (BP Location: Right Arm, Patient Position: Sitting, Cuff Size: Normal)   Pulse 63   Temp (!) 97.3 F (36.3 C) (Temporal)   Ht $R'5\' 4"'il$  (1.626 m)   Wt 154 lb 6.4 oz (70 kg)   SpO2 97%   BMI 26.50 kg/m   Physical Exam Vitals reviewed.  Constitutional:      Appearance: Normal appearance. He is normal weight.  HENT:     Head: Normocephalic.     Right Ear: Tympanic membrane normal.     Left Ear: Tympanic membrane normal.     Nose: Nose normal.  Eyes:     Extraocular Movements: Extraocular movements intact.     Pupils: Pupils are equal, round, and reactive to light.  Cardiovascular:     Rate and Rhythm: Normal rate and regular rhythm.  Pulmonary:     Effort: Pulmonary effort is normal.  Breath sounds: Normal breath sounds.  Abdominal:     General: Bowel sounds are normal.  Musculoskeletal:        General: Normal range of motion.     Cervical back: Normal range of motion and neck supple.  Skin:    General: Skin is warm and dry.  Neurological:     Mental Status: He is alert and oriented to person, place, and time.  Psychiatric:        Mood and Affect: Mood normal.        Behavior: Behavior normal.        Thought Content: Thought content normal.        Judgment: Judgment normal.     Assessment & Plan:  Hill was seen today for new patient (initial visit).  Diagnoses and all orders for this visit:  Encounter to establish care Juluis Mire, NP-C will be your  (PCP) she is mastered prepared . She is skilled to diagnosed and treat illness. Also able to answer health concern as well as continuing care of varied medical conditions, not limited by cause, organ system, or  diagnosis.   -     CBC with Differential  Family history of diabetes mellitus in mother -     HgB A1c 5.4.  Per ADA guidelines she is not prediabetic or diabetic  Screening for cholesterol level -     Lipid Panel  Encounter for screening for HIV Healthcare maintenance and care gaps -     HIV Antibody (routine testing w rflx)  Need for hepatitis C screening test Healthcare maintenance and care gaps -     Hepatitis C Antibody  Nocturia Rule out any underlying kidney damage. If negative will check PSA on follow-up -     CMP14+EGFR    Outpatient Encounter Medications as of 08/13/2020  Medication Sig  . [DISCONTINUED] aspirin 325 MG tablet Take 325 mg by mouth every 6 (six) hours as needed for mild pain.  . [DISCONTINUED] benzonatate (TESSALON) 100 MG capsule Take 1 capsule (100 mg total) by mouth every 8 (eight) hours.  . [DISCONTINUED] guaiFENesin (MUCINEX) 600 MG 12 hr tablet Take by mouth 2 (two) times daily.  . [DISCONTINUED] ibuprofen (ADVIL,MOTRIN) 800 MG tablet Take 1 tablet (800 mg total) by mouth 3 (three) times daily.  . [DISCONTINUED] oseltamivir (TAMIFLU) 75 MG capsule Take 1 capsule (75 mg total) by mouth every 12 (twelve) hours.   No facility-administered encounter medications on file as of 08/13/2020.    Follow-up: No follow-ups on file.   Kerin Perna, NP

## 2020-08-13 NOTE — Patient Instructions (Signed)

## 2020-08-14 ENCOUNTER — Encounter (INDEPENDENT_AMBULATORY_CARE_PROVIDER_SITE_OTHER): Payer: Self-pay | Admitting: Primary Care

## 2020-08-14 LAB — LIPID PANEL
Chol/HDL Ratio: 6.2 ratio — ABNORMAL HIGH (ref 0.0–5.0)
Cholesterol, Total: 168 mg/dL (ref 100–199)
HDL: 27 mg/dL — ABNORMAL LOW (ref >39)
LDL Chol Calc (NIH): 99 mg/dL (ref 0–99)
Triglycerides: 242 mg/dL — ABNORMAL HIGH (ref 0–149)
VLDL Cholesterol Cal: 42 mg/dL — ABNORMAL HIGH (ref 5–40)

## 2020-08-14 LAB — CBC WITH DIFFERENTIAL/PLATELET
Basophils Absolute: 0 10*3/uL (ref 0.0–0.2)
Basos: 0 %
EOS (ABSOLUTE): 0.1 10*3/uL (ref 0.0–0.4)
Eos: 1 %
Hematocrit: 45.4 % (ref 37.5–51.0)
Hemoglobin: 14.9 g/dL (ref 13.0–17.7)
Immature Grans (Abs): 0 10*3/uL (ref 0.0–0.1)
Immature Granulocytes: 0 %
Lymphocytes Absolute: 2.7 10*3/uL (ref 0.7–3.1)
Lymphs: 33 %
MCH: 26.6 pg (ref 26.6–33.0)
MCHC: 32.8 g/dL (ref 31.5–35.7)
MCV: 81 fL (ref 79–97)
Monocytes Absolute: 0.5 10*3/uL (ref 0.1–0.9)
Monocytes: 6 %
Neutrophils Absolute: 4.9 10*3/uL (ref 1.4–7.0)
Neutrophils: 60 %
Platelets: 219 10*3/uL (ref 150–450)
RBC: 5.6 x10E6/uL (ref 4.14–5.80)
RDW: 12.6 % (ref 11.6–15.4)
WBC: 8.3 10*3/uL (ref 3.4–10.8)

## 2020-08-14 LAB — CMP14+EGFR
ALT: 19 IU/L (ref 0–44)
AST: 16 IU/L (ref 0–40)
Albumin/Globulin Ratio: 1.6 (ref 1.2–2.2)
Albumin: 4.6 g/dL (ref 4.0–5.0)
Alkaline Phosphatase: 90 IU/L (ref 44–121)
BUN/Creatinine Ratio: 21 — ABNORMAL HIGH (ref 9–20)
BUN: 16 mg/dL (ref 6–20)
Bilirubin Total: 0.3 mg/dL (ref 0.0–1.2)
CO2: 26 mmol/L (ref 20–29)
Calcium: 9.6 mg/dL (ref 8.7–10.2)
Chloride: 103 mmol/L (ref 96–106)
Creatinine, Ser: 0.77 mg/dL (ref 0.76–1.27)
GFR calc Af Amer: 135 mL/min/{1.73_m2} (ref >59)
GFR calc non Af Amer: 117 mL/min/{1.73_m2} (ref >59)
Globulin, Total: 2.8 g/dL (ref 1.5–4.5)
Glucose: 91 mg/dL (ref 65–99)
Potassium: 4 mmol/L (ref 3.5–5.2)
Sodium: 139 mmol/L (ref 134–144)
Total Protein: 7.4 g/dL (ref 6.0–8.5)

## 2020-08-14 LAB — HEPATITIS C ANTIBODY: Hep C Virus Ab: 0.1 s/co ratio (ref 0.0–0.9)

## 2020-08-14 LAB — HIV ANTIBODY (ROUTINE TESTING W REFLEX): HIV Screen 4th Generation wRfx: NONREACTIVE

## 2020-08-16 ENCOUNTER — Other Ambulatory Visit (INDEPENDENT_AMBULATORY_CARE_PROVIDER_SITE_OTHER): Payer: Self-pay | Admitting: Primary Care

## 2020-08-16 ENCOUNTER — Telehealth (INDEPENDENT_AMBULATORY_CARE_PROVIDER_SITE_OTHER): Payer: Self-pay

## 2020-08-16 DIAGNOSIS — E782 Mixed hyperlipidemia: Secondary | ICD-10-CM

## 2020-08-16 MED ORDER — ATORVASTATIN CALCIUM 40 MG PO TABS: 40.0000 mg | ORAL_TABLET | Freq: Every day | ORAL | 3 refills | Status: DC

## 2020-08-16 NOTE — Telephone Encounter (Signed)
Contacted patient but he was at work. Per DPR results given to patients wife. She is aware that all labs are normal except elevated cholesterol. Informed wife that cholesterol medication has been sent to pharmacy and patient should take at night. PCP will recheck cholesterol in 3 months. Wife verbalized understanding. Maryjean Morn, CMA

## 2020-08-16 NOTE — Telephone Encounter (Signed)
-----   Message from Grayce Sessions, NP sent at 08/16/2020  3:12 PM EDT ----- Labs are normal except cholesterol.  Sent in medication to your pharmacy take at night daily will reevaluate in 3 months

## 2020-11-22 ENCOUNTER — Other Ambulatory Visit: Payer: BC Managed Care – PPO

## 2021-03-19 ENCOUNTER — Other Ambulatory Visit: Payer: Self-pay

## 2021-03-19 ENCOUNTER — Encounter (INDEPENDENT_AMBULATORY_CARE_PROVIDER_SITE_OTHER): Payer: Self-pay | Admitting: Primary Care

## 2021-03-19 ENCOUNTER — Ambulatory Visit (INDEPENDENT_AMBULATORY_CARE_PROVIDER_SITE_OTHER): Payer: BC Managed Care – PPO | Admitting: Primary Care

## 2021-03-19 VITALS — BP 118/73 | HR 72 | Temp 97.3°F | Ht 64.0 in | Wt 156.4 lb

## 2021-03-19 DIAGNOSIS — G8929 Other chronic pain: Secondary | ICD-10-CM

## 2021-03-19 DIAGNOSIS — M545 Low back pain, unspecified: Secondary | ICD-10-CM | POA: Diagnosis not present

## 2021-03-19 DIAGNOSIS — E782 Mixed hyperlipidemia: Secondary | ICD-10-CM | POA: Diagnosis not present

## 2021-03-19 NOTE — Progress Notes (Signed)
.   Established Patient Office Visit  Subjective:  Patient ID: Andrew Cardenas, male    DOB: January 07, 1986  Age: 35 y.o. MRN: [id number]  CC:  Chief Complaint  Patient presents with  . Follow-up    Labs     HPI Mr. Andrew Cardenas is a 35 year old male who presents for follow-up on hyper lipidemia.  Previous labs were elevated and placed on medication which he has been taking daily with no problems. .me  He also may have a back strain,noticed when lifting objects his lower back starts to hurt feels like pressure and pulling.  He denies any problems with bladder or bowel with radiating down legs.   Social History   Socioeconomic History  . Marital status: Married    Spouse name: Not on file  . Number of children: Not on file  . Years of education: Not on file  . Highest education level: Not on file  Occupational History  . Not on file  Tobacco Use  . Smoking status: Never Smoker  . Smokeless tobacco: Never Used  Substance and Sexual Activity  . Alcohol use: No  . Drug use: No  . Sexual activity: Not on file  Other Topics Concern  . Not on file  Social History Narrative  . Not on file   Social Determinants of Health   Financial Resource Strain: Not on file  Food Insecurity: Not on file  Transportation Needs: Not on file  Physical Activity: Not on file  Stress: Not on file  Social Connections: Not on file  Intimate Partner Violence: Not on file    Outpatient Medications Prior to Visit  Medication Sig Dispense Refill  . atorvastatin (LIPITOR) 40 MG tablet Take 1 tablet (40 mg total) by mouth daily. 90 tablet 3   No facility-administered medications prior to visit.    No Known Allergies  ROS Review of Systems  Musculoskeletal: Positive for back pain.  All other systems reviewed and are negative.     Objective:    BP 118/73 (BP Location: Right Arm, Patient Position: Sitting, Cuff Size: Normal)   Pulse 72   Temp (!) 97.3 F (36.3 C) (Temporal)   Ht 5\' 4"  (1.626 m)    Wt 156 lb 6.4 oz (70.9 kg)   SpO2 97%   BMI 26.85 kg/m  Wt Readings from Last 3 Encounters:  03/19/21 156 lb 6.4 oz (70.9 kg)  08/13/20 154 lb 6.4 oz (70 kg)  11/12/18 156 lb 12.8 oz (71.1 kg)   General: No apparent distress.  Eyes: Extraocular eye movements intact, pupils equal and round. Neck: Supple, trachea midline. Thyroid: No enlargement, mobile without fixation, no tenderness. Cardiovascular: Regular rhythm and rate, no murmur, normal radial pulses. Respiratory: Normal respiratory effort, clear to auscultation. Gastrointestinal: Normal pitch active bowel sounds, nontender abdomen without distention or appreciable hepatomegaly. Neurologic: normal as tested Musculoskeletal: Normal muscle tone, no tenderness on palpation of tibia, no excessive thoracic kyphosis. Skin: Appropriate warmth, no visible rash. Mental status: Alert, conversant, speech clear, thought logical, appropriate mood and affect, no hallucinations or delusions evident. Hematologic/lymphatic: No cervical adenopathy, no visible ecchymoses.  Health Maintenance Due  Topic Date Due  . COVID-19 Vaccine (3 - Booster for Pfizer series) 09/17/2020    There are no preventive care reminders to display for this patient.  No results found for: TSH Lab Results  Component Value Date   WBC 8.3 08/13/2020   HGB 14.9 08/13/2020   HCT 45.4 08/13/2020   MCV 81 08/13/2020  PLT 219 08/13/2020   Lab Results  Component Value Date   NA 139 08/13/2020   K 4.0 08/13/2020   CO2 26 08/13/2020   GLUCOSE 91 08/13/2020   BUN 16 08/13/2020   CREATININE 0.77 08/13/2020   BILITOT 0.3 08/13/2020   ALKPHOS 90 08/13/2020   AST 16 08/13/2020   ALT 19 08/13/2020   PROT 7.4 08/13/2020   ALBUMIN 4.6 08/13/2020   CALCIUM 9.6 08/13/2020   ANIONGAP 11 06/26/2015   Lab Results  Component Value Date   CHOL 168 08/13/2020   Lab Results  Component Value Date   HDL 27 (L) 08/13/2020   Lab Results  Component Value Date   LDLCALC  99 08/13/2020   Lab Results  Component Value Date   TRIG 242 (H) 08/13/2020   Lab Results  Component Value Date   CHOLHDL 6.2 (H) 08/13/2020   Lab Results  Component Value Date   HGBA1C 5.4 08/13/2020      Assessment & Plan:  Santonio was seen today for follow-up.  Diagnoses and all orders for this visit:  Mixed hyperlipidemia Encouraged heart healthy diet, increase exercise, avoid trans fats, Currently on atorvastatin 40mg   at bedtime  Chronic bilateral low back pain without sciatica BACK PAIN  Location: lumbar/sacral Quality: aching, heaviness and tight (pulling) Onset: sudden Worse with: lifting      Better with: standing/rest Radiation: none Trauma: None Best sitting/standing/leaning forward: Yes  Red Flags Fecal/urinary incontinence: no  Numbness/Weakness: no  Fever/chills/sweats: no  Night pain: no  Unexplained weight loss: no  No relief with bedrest: yes  h/o cancer/immunosuppression: no  IV drug use: no  PMH of osteoporosis or chronic steroid use: no  May use over-the-counter ibuprofen or Tylenol, heat or cool compresses for relief  Follow-up: Return in about 6 months (around 09/19/2021) for hyperlipidemia/ PE.    13/09/2021, NP

## 2021-03-19 NOTE — Patient Instructions (Signed)
Back Injury Prevention Back injuries can be very painful. They can also be difficult to heal. After having one back injury, you are more likely to have another one again. It is important to learn how to avoid injuring or re-injuring your back. The following tips can help you to prevent a back injury. What actions can I take to prevent back injuries? Changes in your diet Talk with your doctor about what to eat. Some foods can make the bones strong.  Talk with your doctor about how much calcium and vitamin D you need each day. These nutrients help to prevent weakening of the bones (osteoporosis).  Eat foods that have calcium. These include: ? Dairy products. ? Green leafy vegetables. ? Food and drinks that have had calcium added to them (fortified).  Eat foods that have vitamin D. These include: ? Milk. ? Food and drinks that have had vitamin D added to them.  Take other supplements and vitamins only as told by your doctor. Physical fitness Physical fitness makes your bones and muscles strong. It also improves your balance and strength.  Exercise for 30 minutes per day on most days of the week, or as told by your doctor. Make sure to: ? Do aerobic exercises, such as walking, jogging, biking, or swimming. ? Do exercises that increase balance and strength, such as tai chi and yoga. ? Do stretching exercises. This helps with flexibility. ? Develop strong belly (abdominal) muscles. Your belly muscles help to support your back.  Stay at a healthy weight. This lowers your risk of a back injury. Good posture Prevent back injuries by developing and maintaining a good posture. To do this:  Sit up straight and stand up straight. Avoid leaning forward when you sit or hunching over when you stand.  Choose chairs that have good low-back (lumbar) support.  If you work at a desk: ? Sit close to it so you do not need to lean over. ? Keep your chin tucked in. ? Keep your neck drawn back. ? Keep  your elbows bent so that your arms make a corner (right angle).  When you drive: ? Sit high and close to the steering wheel. Add a low-back support to your car seat, if needed. ? Take breaks every hour if you are driving for long periods of time.  Avoid sitting or standing in one position for very long. Take breaks to get up, stretch, and walk around at least once every hour.  Sleep on your side with your knees slightly bent, or sleep on your back with a pillow under your knees.         Lifting, twisting, and reaching  Heavy lifting ? Avoid heavy lifting, especially lifting over and over again. If you must do heavy lifting:  Stretch before lifting.  Work slowly.  Rest between lifts.  Use a tool such as a cart or a dolly to move objects if one is available.  Make several small trips instead of carrying one heavy load.  Ask for help when you need it, especially when moving big objects. ? Follow these steps when lifting:  Stand with your feet shoulder-width apart.  Get as close to the object as you can. Do not pick up a heavy object that is far from your body.  Use handles or lifting straps if they are available.  Bend at your knees. Squat down, but keep your heels off the floor.  Keep your shoulders back. Keep your chin tucked in. Keep your  back straight.  Lift the object slowly while you tighten the muscles in your legs, belly, and bottom. Keep the object as close to the center of your body as possible. ? Follow these steps when putting down a heavy load:  Stand with your feet shoulder-width apart.  Lower the object slowly while you tighten the muscles in your legs, belly, and bottom. Keep the object as close to the center of your body as possible.  Keep your shoulders back. Keep your chin tucked in. Keep your back straight.  Bend at your knees. Squat down, but keep your heels off the floor.  Use handles or lifting straps if they are available.  Twisting and  reaching ? Avoid lifting heavy objects above your waist. ? Do not twist at your waist while you are lifting or carrying a load. If you need to turn, move your feet. ? Do not bend over without bending at your knees. ? Avoid reaching over your head, across a table, or for an object on a high surface.   Other things to do  Avoid wet floors and icy ground. Keep sidewalks clear of ice to prevent falls.  Do not sleep on a mattress that is too soft or too hard.  Store heavier objects on shelves at waist level.  Store lighter objects on lower or higher shelves.  Find ways to lower your stress, such as: ? Exercise. ? Massage. ? Relaxation techniques.  Talk with your doctor if you feel anxious or depressed. These conditions can make back pain worse.  Wear flat heel shoes with cushioned soles.  Use both shoulder straps when carrying a backpack.  Do not use any products that contain nicotine or tobacco, such as cigarettes and e-cigarettes. If you need help quitting, ask your doctor.   Summary  Back injuries can be very painful and difficult to heal.  You can keep your back healthy by making certain changes. These include eating foods that make bones strong, working on being physically fit, developing a good posture, and lifting heavy objects in a safe way. This information is not intended to replace advice given to you by your health care provider. Make sure you discuss any questions you have with your health care provider. Document Revised: 07/19/2019 Document Reviewed: 12/17/2017 Elsevier Patient Education  2021 [employer].

## 2021-03-19 NOTE — Progress Notes (Signed)
Pt states he sometimes has back pain when carrying heavy things  Denies any pain today

## 2021-09-19 ENCOUNTER — Other Ambulatory Visit: Payer: Self-pay

## 2021-09-19 ENCOUNTER — Encounter (INDEPENDENT_AMBULATORY_CARE_PROVIDER_SITE_OTHER): Payer: Self-pay | Admitting: Primary Care

## 2021-09-19 ENCOUNTER — Ambulatory Visit (INDEPENDENT_AMBULATORY_CARE_PROVIDER_SITE_OTHER): Payer: BC Managed Care – PPO | Admitting: Primary Care

## 2021-09-19 VITALS — BP 120/78 | HR 67 | Temp 97.3°F | Ht 63.0 in | Wt 157.4 lb

## 2021-09-19 DIAGNOSIS — E782 Mixed hyperlipidemia: Secondary | ICD-10-CM | POA: Diagnosis not present

## 2021-09-19 DIAGNOSIS — Z0000 Encounter for general adult medical examination without abnormal findings: Secondary | ICD-10-CM

## 2021-09-19 DIAGNOSIS — Z23 Encounter for immunization: Secondary | ICD-10-CM | POA: Diagnosis not present

## 2021-09-19 NOTE — Patient Instructions (Signed)
Influenza, Adult °Influenza is also called "the flu." It is an infection in the lungs, nose, and throat (respiratory tract). It spreads easily from person to person (is contagious). The flu causes symptoms that are like a cold, along with high fever and body aches. °What are the causes? °This condition is caused by the influenza virus. You can get the virus by: °Breathing in droplets that are in the air after a person infected with the flu coughed or sneezed. °Touching something that has the virus on it and then touching your mouth, nose, or eyes. °What increases the risk? °Certain things may make you more likely to get the flu. These include: °Not washing your hands often. °Having close contact with many people during cold and flu season. °Touching your mouth, eyes, or nose without first washing your hands. °Not getting a flu shot every year. °You may have a higher risk for the flu, and serious problems, such as a lung infection (pneumonia), if you: °Are older than 65. °Are pregnant. °Have a weakened disease-fighting system (immune system) because of a disease or because you are taking certain medicines. °Have a long-term (chronic) condition, such as: °Heart, kidney, or lung disease. °Diabetes. °Asthma. °Have a liver disorder. °Are very overweight (morbidly obese). °Have anemia. °What are the signs or symptoms? °Symptoms usually begin suddenly and last 4-14 days. They may include: °Fever and chills. °Headaches, body aches, or muscle aches. °Sore throat. °Cough. °Runny or stuffy (congested) nose. °Feeling discomfort in your chest. °Not wanting to eat as much as normal. °Feeling weak or tired. °Feeling dizzy. °Feeling sick to your stomach or throwing up. °How is this treated? °If the flu is found early, you can be treated with antiviral medicine. This can help to reduce how bad the illness is and how long it lasts. This may be given by mouth or through an IV tube. °Taking care of yourself at home can help your  symptoms get better. Your doctor may want you to: °Take over-the-counter medicines. °Drink plenty of fluids. °The flu often goes away on its own. If you have very bad symptoms or other problems, you may be treated in a hospital. °Follow these instructions at home: °  °Activity °Rest as needed. Get plenty of sleep. °Stay home from work or school as told by your doctor. °Do not leave home until you do not have a fever for 24 hours without taking medicine. °Leave home only to go to your doctor. °Eating and drinking °Take an ORS (oral rehydration solution). This is a drink that is sold at pharmacies and stores. °Drink enough fluid to keep your pee pale yellow. °Drink clear fluids in small amounts as you are able. Clear fluids include: °Water. °Ice chips. °Fruit juice mixed with water. °Low-calorie sports drinks. °Eat bland foods that are easy to digest. Eat small amounts as you are able. These foods include: °Bananas. °Applesauce. °Rice. °Lean meats. °Toast. °Crackers. °Do not eat or drink: °Fluids that have a lot of sugar or caffeine. °Alcohol. °Spicy or fatty foods. °General instructions °Take over-the-counter and prescription medicines only as told by your doctor. °Use a cool mist humidifier to add moisture to the air in your home. This can make it easier for you to breathe. °When using a cool mist humidifier, clean it daily. Empty water and replace with clean water. °Cover your mouth and nose when you cough or sneeze. °Wash your hands with soap and water often and for at least 20 seconds. This is also important after   you cough or sneeze. If you cannot use soap and water, use alcohol-based hand sanitizer. °Keep all follow-up visits. °How is this prevented? ° °Get a flu shot every year. You may get the flu shot in late summer, fall, or winter. Ask your doctor when you should get your flu shot. °Avoid contact with people who are sick during fall and winter. This is cold and flu season. °Contact a doctor if: °You get  new symptoms. °You have: °Chest pain. °Watery poop (diarrhea). °A fever. °Your cough gets worse. °You start to have more mucus. °You feel sick to your stomach. °You throw up. °Get help right away if you: °Have shortness of breath. °Have trouble breathing. °Have skin or nails that turn a bluish color. °Have very bad pain or stiffness in your neck. °Get a sudden headache. °Get sudden pain in your face or ear. °Cannot eat or drink without throwing up. °These symptoms may represent a serious problem that is an emergency. Get medical help right away. Call your local emergency services (911 in the [place] °Do not wait to see if the symptoms will go away. °Do not drive yourself to the hospital. °Summary °Influenza is also called "the flu." It is an infection in the lungs, nose, and throat. It spreads easily from person to person. °Take over-the-counter and prescription medicines only as told by your doctor. °Getting a flu shot every year is the best way to not get the flu. °This information is not intended to replace advice given to you by your health care provider. Make sure you discuss any questions you have with your health care provider. °Document Revised: 06/14/2020 Document Reviewed: 06/14/2020 °Elsevier Patient Education © 2022 Elsevier Inc. ° °

## 2021-09-19 NOTE — Progress Notes (Signed)
[facility]  Patient presents to clinic today for annual exam.  Patient is fasting for labs.  Acute Concerns: Hip pain left recently started walking  Chronic Issues: Non  Health Maintenance: Immunizations -- up to date  Colon Cancer Screening -- N/A HIV/Hep C Screening -- N/A  No past medical history on file.  No past surgical history on file.  Current Outpatient Medications on File Prior to Visit  Medication Sig Dispense Refill   atorvastatin (LIPITOR) 40 MG tablet Take 1 tablet (40 mg total) by mouth daily. (Patient not taking: Reported on 09/19/2021) 90 tablet 3   No current facility-administered medications on file prior to visit.    No Known Allergies  No family history on file.  Social History   Socioeconomic History   Marital status: Married    Spouse name: Not on file   Number of children: Not on file   Years of education: Not on file   Highest education level: Not on file  Occupational History   Not on file  Tobacco Use   Smoking status: Never   Smokeless tobacco: Never  Substance and Sexual Activity   Alcohol use: No   Drug use: No   Sexual activity: Not on file  Other Topics Concern   Not on file  Social History Narrative   Not on file   Social Determinants of Health   Financial Resource Strain: Not on file  Food Insecurity: Not on file  Transportation Needs: Not on file  Physical Activity: Not on file  Stress: Not on file  Social Connections: Not on file  Intimate Partner Violence: Not on file    ROS Pertinent positive noted in HPI  BP 120/78 (BP Location: Right Arm, Patient Position: Sitting, Cuff Size: Normal)   Pulse 67   Temp (!) 97.3 F (36.3 C) (Temporal)   Ht 5\' 3"  (1.6 m)   Wt 157 lb 6.4 oz (71.4 kg)   SpO2 95%   BMI 27.88 kg/m   Physical Exam  Physical exam: General: Vital signs reviewed.  Patient is well-developed and well-nourished,male Body mass index is 27.88 kg/m. in no acute distress and  cooperative with exam. Head: Normocephalic and atraumatic. Eyes: EOMI, conjunctivae normal, no scleral icterus. Neck: Supple, trachea midline, normal ROM, no JVD, masses, thyromegaly, or carotid bruit present. Cardiovascular: RRR, S1 normal, S2 normal, no murmurs, gallops, or rubs. Pulmonary/Chest: Clear to auscultation bilaterally, no wheezes, rales, or rhonchi. Abdominal: Soft, non-tender, non-distended, BS +, no masses, organomegaly, or guarding present. Musculoskeletal: No joint deformities, erythema, or stiffness, ROM full and nontender. Extremities: No lower extremity edema bilaterally,  pulses symmetric and intact bilaterally. No cyanosis or clubbing. Neurological: A&O x3, Strength is normal Skin: Warm, dry and intact. No rashes or erythema. Psychiatric: Normal mood and affect. speech and behavior is normal. Cognition and memory are normal.    No results found for this or any previous visit (from the past 2160 hour(s)).  Assessment/Plan: Rydan was seen today for hyperlipidemia and medication refill.  Diagnoses and all orders for this visit:  Mixed hyperlipidemia Not taking his atorvastatin .  Healthy lifestyle diet of fruits vegetables fish nuts whole grains and low saturated fat . Foods high in cholesterol or liver, fatty meats,cheese, butter avocados, nuts and seeds, chocolate and fried foods. -     Lipid Panel -     CMP14+EGFR -     CBC with Differential  Annual physical exam -     Lipid Panel -  CMP14+EGFR -     CBC with Differential  Need for immunization against influenza -     Flu Vaccine QUAD 72mo+IM (Fluarix, Fluzone & Alfiuria Quad PF)   This note has been created with Surveyor, quantity. Any transcriptional errors are unintentional.   Kerin Perna, NP

## 2021-09-19 NOTE — Progress Notes (Signed)
Pt is fasting  Pt complains of hip pain sometimes when he walks

## 2021-09-20 LAB — CMP14+EGFR
ALT: 29 IU/L (ref 0–44)
AST: 23 IU/L (ref 0–40)
Albumin/Globulin Ratio: 1.6 (ref 1.2–2.2)
Albumin: 4.4 g/dL (ref 4.0–5.0)
Alkaline Phosphatase: 111 IU/L (ref 44–121)
BUN/Creatinine Ratio: 18 (ref 9–20)
BUN: 13 mg/dL (ref 6–20)
Bilirubin Total: 0.5 mg/dL (ref 0.0–1.2)
CO2: 24 mmol/L (ref 20–29)
Calcium: 9.2 mg/dL (ref 8.7–10.2)
Chloride: 103 mmol/L (ref 96–106)
Creatinine, Ser: 0.72 mg/dL — ABNORMAL LOW (ref 0.76–1.27)
Globulin, Total: 2.8 g/dL (ref 1.5–4.5)
Glucose: 89 mg/dL (ref 70–99)
Potassium: 4 mmol/L (ref 3.5–5.2)
Sodium: 139 mmol/L (ref 134–144)
Total Protein: 7.2 g/dL (ref 6.0–8.5)
eGFR: 121 mL/min/{1.73_m2} (ref >59)

## 2021-09-20 LAB — CBC WITH DIFFERENTIAL/PLATELET
Basophils Absolute: 0 10*3/uL (ref 0.0–0.2)
Basos: 0 %
EOS (ABSOLUTE): 0.1 10*3/uL (ref 0.0–0.4)
Eos: 1 %
Hematocrit: 46.1 % (ref 37.5–51.0)
Hemoglobin: 15 g/dL (ref 13.0–17.7)
Immature Grans (Abs): 0 10*3/uL (ref 0.0–0.1)
Immature Granulocytes: 0 %
Lymphocytes Absolute: 2.9 10*3/uL (ref 0.7–3.1)
Lymphs: 31 %
MCH: 26.5 pg — ABNORMAL LOW (ref 26.6–33.0)
MCHC: 32.5 g/dL (ref 31.5–35.7)
MCV: 81 fL (ref 79–97)
Monocytes Absolute: 0.6 10*3/uL (ref 0.1–0.9)
Monocytes: 7 %
Neutrophils Absolute: 5.7 10*3/uL (ref 1.4–7.0)
Neutrophils: 61 %
Platelets: 206 10*3/uL (ref 150–450)
RBC: 5.66 x10E6/uL (ref 4.14–5.80)
RDW: 12.8 % (ref 11.6–15.4)
WBC: 9.4 10*3/uL (ref 3.4–10.8)

## 2021-09-20 LAB — LIPID PANEL
Chol/HDL Ratio: 4.9 ratio (ref 0.0–5.0)
Cholesterol, Total: 128 mg/dL (ref 100–199)
HDL: 26 mg/dL — ABNORMAL LOW (ref >39)
LDL Chol Calc (NIH): 64 mg/dL (ref 0–99)
Triglycerides: 229 mg/dL — ABNORMAL HIGH (ref 0–149)
VLDL Cholesterol Cal: 38 mg/dL (ref 5–40)

## 2021-09-23 ENCOUNTER — Other Ambulatory Visit (INDEPENDENT_AMBULATORY_CARE_PROVIDER_SITE_OTHER): Payer: Self-pay | Admitting: Primary Care

## 2021-09-23 DIAGNOSIS — E782 Mixed hyperlipidemia: Secondary | ICD-10-CM

## 2021-09-23 MED ORDER — ATORVASTATIN CALCIUM 40 MG PO TABS: 40.0000 mg | ORAL_TABLET | Freq: Every day | ORAL | 3 refills | Status: AC

## 2021-10-01 ENCOUNTER — Telehealth (INDEPENDENT_AMBULATORY_CARE_PROVIDER_SITE_OTHER): Payer: Self-pay

## 2021-10-01 NOTE — Telephone Encounter (Signed)
Call placed to patient with assistance of [employer] inter[employer]([id number]) patient was not available. Results provided to his wife per DPR. He has already picked up the medication. No questions and wife verbalized understanding. Maryjean Morn, CMA

## 2021-10-01 NOTE — Telephone Encounter (Signed)
-----   Message from Grayce Sessions, NP sent at 09/23/2021  4:00 PM EST ----- Your cholesterol is high, Increase risk of heart attack and/or stroke.  To reduce your Cholesterol , Remember - more fruits and vegetables, more fish, and limit red meat and dairy products. More soy, nuts, beans, barley, lentils, oats and plant sterol ester enriched margarine instead of butter. I also encourage eliminating sugar and processed food. New script sent for atorvastatin 40mg  at bedtime

## 2021-10-23 ENCOUNTER — Other Ambulatory Visit: Payer: Self-pay

## 2021-10-23 ENCOUNTER — Ambulatory Visit (HOSPITAL_COMMUNITY)
Admission: EM | Admit: 2021-10-23 | Discharge: 2021-10-23 | Disposition: A | Discharge status: Home / Self Care | Payer: BC Managed Care – PPO | Attending: Emergency Medicine | Admitting: Emergency Medicine

## 2021-10-23 ENCOUNTER — Encounter (HOSPITAL_COMMUNITY): Payer: Self-pay | Admitting: Emergency Medicine

## 2021-10-23 DIAGNOSIS — R509 Fever, unspecified: Secondary | ICD-10-CM | POA: Diagnosis not present

## 2021-10-23 DIAGNOSIS — J029 Acute pharyngitis, unspecified: Secondary | ICD-10-CM | POA: Diagnosis not present

## 2021-10-23 DIAGNOSIS — N3001 Acute cystitis with hematuria: Secondary | ICD-10-CM | POA: Diagnosis not present

## 2021-10-23 HISTORY — DX: Pure hypercholesterolemia, unspecified: E78.00

## 2021-10-23 LAB — POCT URINALYSIS DIPSTICK, ED / UC
Bilirubin Urine: NEGATIVE
Glucose, UA: 250 mg/dL — AB
Ketones, ur: NEGATIVE mg/dL
Leukocytes,Ua: NEGATIVE
Nitrite: NEGATIVE
Protein, ur: NEGATIVE mg/dL
Specific Gravity, Urine: 1.02 (ref 1.005–1.030)
Urobilinogen, UA: 2 mg/dL — ABNORMAL HIGH (ref 0.0–1.0)
pH: 7.5 (ref 5.0–8.0)

## 2021-10-23 LAB — POC INFLUENZA A AND B ANTIGEN (URGENT CARE ONLY)
INFLUENZA A ANTIGEN, POC: NEGATIVE
INFLUENZA B ANTIGEN, POC: NEGATIVE

## 2021-10-23 MED ORDER — SULFAMETHOXAZOLE-TRIMETHOPRIM 800-160 MG PO TABS: 1.0000 | ORAL_TABLET | Freq: Two times a day (BID) | ORAL | 0 refills | Status: AC

## 2021-10-23 MED ORDER — SULFAMETHOXAZOLE-TRIMETHOPRIM 800-160 MG PO TABS: 1.0000 | ORAL_TABLET | Freq: Two times a day (BID) | ORAL | 0 refills | Status: DC

## 2021-10-23 NOTE — ED Triage Notes (Signed)
Patient c/o chills, sore throat, and fever x 1 day.  Patient endorses lower back pain and lower ABD pain.   Patient denies diarrhea.    Patient has taken Tylenol today (last does was 1700).

## 2021-10-23 NOTE — Discharge Instructions (Signed)
Your urinalysis today showed blood in your urine, because you have a fever I will move forward with treatment with a urinary infection we will wait to see if your urine grows bacteria in the lab  Begin taking Bactrim twice a day for the next 7 days, if any changes need to be made to your medication you will be notified  At any point if your abdominal pain becomes more severe please go to the nearest emergency department for evaluation of a kidney stone which is also a possibility

## 2021-10-23 NOTE — ED Provider Notes (Signed)
[facility]    CSN: [id number] Arrival date & time: 10/23/21  1849      History   Chief Complaint Chief Complaint  Patient presents with   Chills   Sore Throat   Fever    HPI Andrew Cardenas is a 35 y.o. male.   Patient presents with chills, sore throat, fever, lower back pain, suprapubic abdominal pain that radiates into the groin for 1 day.  No known sick contacts.  Has attempted use of Tylenol which was somewhat helpful.  Denies nausea, vomiting, diarrhea, constipation, dysuria, hematuria, urinary frequency, urgency or retention, penile discharge, penile or testicle swelling.  No pertinent medical history.  Declined use of interpreter.  Past Medical History:  Diagnosis Date   Hypercholesteremia     There are no problems to display for this patient.   History reviewed. No pertinent surgical history.     Home Medications    Prior to Admission medications   Medication Sig Start Date End Date Taking? Authorizing Provider  atorvastatin (LIPITOR) 40 MG tablet Take 1 tablet (40 mg total) by mouth daily. 09/23/21  Yes Grayce Sessions, NP    Family History History reviewed. No pertinent family history.  Social History Social History   Tobacco Use   Smoking status: Never   Smokeless tobacco: Never  Vaping Use   Vaping Use: Never used  Substance Use Topics   Alcohol use: No   Drug use: No     Allergies   Patient has no known allergies.   Review of Systems Review of Systems  Constitutional:  Positive for chills and fever. Negative for activity change, appetite change, diaphoresis, fatigue and unexpected weight change.  HENT:  Positive for sore throat. Negative for congestion, dental problem, drooling, ear discharge, ear pain, facial swelling, hearing loss, mouth sores, nosebleeds, postnasal drip, rhinorrhea, sinus pressure, sinus pain, sneezing, tinnitus, trouble swallowing and voice change.   Respiratory: Negative.    Cardiovascular:  Negative.   Gastrointestinal:  Positive for abdominal pain. Negative for abdominal distention, anal bleeding, blood in stool, constipation, diarrhea, nausea, rectal pain and vomiting.  Genitourinary: Negative.   Musculoskeletal:  Positive for back pain. Negative for arthralgias, gait problem, joint swelling, myalgias, neck pain and neck stiffness.  Skin: Negative.   Neurological: Negative.     Physical Exam Triage Vital Signs ED Triage Vitals  Enc Vitals Group     BP 10/23/21 1903 136/88     Pulse Rate 10/23/21 1903 97     Resp 10/23/21 1903 18     Temp 10/23/21 1903 (!) 101.3 F (38.5 C)     Temp Source 10/23/21 1903 Oral     SpO2 10/23/21 1903 95 %     Weight --      Height --      Head Circumference --      Peak Flow --      Pain Score 10/23/21 1906 7     Pain Loc --      Pain Edu? --      Excl. in GC? --    No data found.  Updated Vital Signs BP 136/88 (BP Location: Right Arm)    Pulse 97    Temp (!) 101.3 F (38.5 C) (Oral)    Resp 18    SpO2 95%   Visual Acuity Right Eye Distance:   Left Eye Distance:   Bilateral Distance:    Right Eye Near:   Left Eye Near:    Bilateral Near:  Physical Exam Constitutional:      Appearance: He is well-developed.  HENT:     Head: Normocephalic.  Eyes:     Extraocular Movements: Extraocular movements intact.  Cardiovascular:     Rate and Rhythm: Normal rate and regular rhythm.     Pulses: Normal pulses.     Heart sounds: Normal heart sounds.  Pulmonary:     Effort: Pulmonary effort is normal.     Breath sounds: Normal breath sounds.  Abdominal:     General: Abdomen is flat. Bowel sounds are normal.     Palpations: Abdomen is soft.     Tenderness: There is abdominal tenderness in the suprapubic area. There is no right CVA tenderness or left CVA tenderness.  Skin:    General: Skin is warm and dry.  Neurological:     Mental Status: He is alert and oriented to person, place, and time. Mental status is at baseline.   Psychiatric:        Mood and Affect: Mood normal.        Behavior: Behavior normal.     UC Treatments / Results  Labs (all labs ordered are listed, but only abnormal results are displayed) Labs Reviewed  POC INFLUENZA A AND B ANTIGEN (URGENT CARE ONLY)    EKG   Radiology No results found.  Procedures Procedures (including critical care time)  Medications Ordered in UC Medications - No data to display  Initial Impression / Assessment and Plan / UC Course  I have reviewed the triage vital signs and the nursing notes.  Pertinent labs & imaging results that were available during my care of the patient were reviewed by me and considered in my medical decision making (see chart for details).  Acute cystitis with hematuria  Flu test negative Urinalysis, positive for hemoglobin but no nitrates or leukocytes, sent for culture, discussed with patient possibility of kidney stone however will prophylactically start antibiotic based on symptomology and presence of fever, given strict precautions at any point if symptoms worsen to go to the nearest emergency department for further evaluation, verbalized understanding and in agreement with plan of care  Final Clinical Impressions(s) / UC Diagnoses   Final diagnoses:  None   Discharge Instructions   None    ED Prescriptions   None    PDMP not reviewed this encounter.   Hans Eden, [place] 10/26/21 2202

## 2021-10-24 LAB — URINE CULTURE: Culture: 50000 — AB

## 2021-10-27 ENCOUNTER — Telehealth (HOSPITAL_COMMUNITY): Payer: Self-pay | Admitting: Emergency Medicine

## 2021-10-27 MED ORDER — CEPHALEXIN 500 MG PO CAPS: 500.0000 mg | ORAL_CAPSULE | Freq: Two times a day (BID) | ORAL | 0 refills | Status: AC

## 2023-05-10 ENCOUNTER — Encounter (HOSPITAL_COMMUNITY): Payer: Self-pay | Admitting: *Deleted

## 2023-05-10 ENCOUNTER — Ambulatory Visit (HOSPITAL_COMMUNITY)
Admission: EM | Admit: 2023-05-10 | Discharge: 2023-05-10 | Disposition: A | Discharge status: Home / Self Care | Payer: BC Managed Care – PPO | Attending: Family Medicine | Admitting: Family Medicine

## 2023-05-10 DIAGNOSIS — N50811 Right testicular pain: Secondary | ICD-10-CM

## 2023-05-10 LAB — CBC WITH DIFFERENTIAL/PLATELET
Abs Immature Granulocytes: 0.03 10*3/uL (ref 0.00–0.07)
Basophils Absolute: 0 10*3/uL (ref 0.0–0.1)
Basophils Relative: 0 %
Eosinophils Absolute: 0.1 10*3/uL (ref 0.0–0.5)
Eosinophils Relative: 1 %
HCT: 44.6 % (ref 39.0–52.0)
Hemoglobin: 14.2 g/dL (ref 13.0–17.0)
Immature Granulocytes: 0 %
Lymphocytes Relative: 26 %
Lymphs Abs: 2.1 10*3/uL (ref 0.7–4.0)
MCH: 26.1 pg (ref 26.0–34.0)
MCHC: 31.8 g/dL (ref 30.0–36.0)
MCV: 82 fL (ref 80.0–100.0)
Monocytes Absolute: 0.6 10*3/uL (ref 0.1–1.0)
Monocytes Relative: 7 %
Neutro Abs: 5.2 10*3/uL (ref 1.7–7.7)
Neutrophils Relative %: 66 %
Platelets: 212 10*3/uL (ref 150–400)
RBC: 5.44 MIL/uL (ref 4.22–5.81)
RDW: 12.6 % (ref 11.5–15.5)
WBC: 8 10*3/uL (ref 4.0–10.5)
nRBC: 0 % (ref 0.0–0.2)

## 2023-05-10 LAB — POCT URINALYSIS DIP (MANUAL ENTRY)
Bilirubin, UA: NEGATIVE
Blood, UA: NEGATIVE
Glucose, UA: NEGATIVE mg/dL
Ketones, POC UA: NEGATIVE mg/dL
Leukocytes, UA: NEGATIVE
Nitrite, UA: NEGATIVE
Protein Ur, POC: NEGATIVE mg/dL
Spec Grav, UA: 1.025 (ref 1.010–1.025)
Urobilinogen, UA: 0.2 E.U./dL
pH, UA: 6 (ref 5.0–8.0)

## 2023-05-10 LAB — COMPREHENSIVE METABOLIC PANEL
ALT: 25 U/L (ref 0–44)
AST: 29 U/L (ref 15–41)
Albumin: 3.7 g/dL (ref 3.5–5.0)
Alkaline Phosphatase: 79 U/L (ref 38–126)
Anion gap: 10 (ref 5–15)
BUN: 18 mg/dL (ref 6–20)
CO2: 26 mmol/L (ref 22–32)
Calcium: 9 mg/dL (ref 8.9–10.3)
Chloride: 104 mmol/L (ref 98–111)
Creatinine, Ser: 0.61 mg/dL (ref 0.61–1.24)
GFR, Estimated: >60 mL/min (ref >60)
Glucose, Bld: 89 mg/dL (ref 70–99)
Potassium: 4.2 mmol/L (ref 3.5–5.1)
Sodium: 140 mmol/L (ref 135–145)
Total Bilirubin: 0.2 mg/dL — ABNORMAL LOW (ref 0.3–1.2)
Total Protein: 7.6 g/dL (ref 6.5–8.1)

## 2023-05-10 NOTE — Discharge Instructions (Addendum)
I have placed a referral to urology. Hopefully you will hear from them in a few days. If not, please call them at the number provided.  You have had labs (blood tests) sent today. We will call you with any significant abnormalities or if there is need to begin or change treatment or pursue further follow up.  You may also review your test results online through MyChart. If you do not have a MyChart account, instructions to sign up should be on your discharge paperwork.

## 2023-05-10 NOTE — ED Triage Notes (Addendum)
Declines video interpreter. C/O intermittent pain across low abd into right testicle and "through to the back" x few months; states seems to occur every morning upon waking, and often wakes him up at night. Denies any penile discharge.

## 2023-05-12 NOTE — ED Provider Notes (Signed)
[facility]   [id number] 05/10/23 Arrival Time: 7829  ASSESSMENT & PLAN:  1. Pain in right testicle    Orders Placed This Encounter  Procedures   CBC with Differential/Platelet    Standing Status:   Standing    Number of Occurrences:   1   Comprehensive metabolic panel    Standing Status:   Standing    Number of Occurrences:   1   Ambulatory referral to Urology    Referral Priority:   Routine    Referral Type:   Consultation    Referral Reason:   Specialty Services Required    Referred to Provider:   Alfredo Martinez, MD    Requested Specialty:   Urology    Number of Visits Requested:   1   POC urinalysis dipstick    Standing Status:   Standing    Number of Occurrences:   1   U/A without signs of infection. No significant abnormalities on CBC/CMP. Recommend urology evaluation for testicular/inguinal pain for a few months. No signs of hernia. Unable to image here. Declines urethral cytology.  Reviewed expectations re: course of current medical issues. Questions answered. Outlined signs and symptoms indicating need for more acute intervention. Patient verbalized understanding. After Visit Summary given.   SUBJECTIVE:  Andrew Cardenas is a 37 y.o. male who presents with complaint of  intermittent pain across low abd/inguinal are into right testicle and "through to the back" x few months; gradual onset when first noted. Does have periods when discomfort is not present. Usu feels more pain in the morning upon waking; occasionally feels overnight. Denies any penile discharge or specific urinary symptoms. Denies testicle swelling.  Social History   Tobacco Use  Smoking Status Never  Smokeless Tobacco Never     OBJECTIVE:  Vitals:   05/10/23 0922  BP: 123/76  Pulse: 60  Resp: 16  Temp: 97.9 F (36.6 C)  TempSrc: Oral  SpO2: 98%    General appearance: alert, cooperative, appears stated age and no distress Throat: lips, mucosa, and tongue normal; teeth and  gums normal Lungs: unlabored respirations; speaks full sentences without difficulty Back: no CVA tenderness; FROM at waist Abdomen: soft, non-tender GU: normal appearing genitalia; mild vague soreness described with examination of R testicle; no appreciable scrotal masses or hernia Skin: warm and dry Psychological: alert and cooperative; normal mood and affect.  Results for orders placed or performed during the hospital encounter of 05/10/23  CBC with Differential/Platelet  Result Value Ref Range   WBC 8.0 4.0 - 10.5 K/uL   RBC 5.44 4.22 - 5.81 MIL/uL   Hemoglobin 14.2 13.0 - 17.0 g/dL   HCT 56.2 13.0 - 86.5 %   MCV 82.0 80.0 - 100.0 fL   MCH 26.1 26.0 - 34.0 pg   MCHC 31.8 30.0 - 36.0 g/dL   RDW 78.4 69.6 - 29.5 %   Platelets 212 150 - 400 K/uL   nRBC 0.0 0.0 - 0.2 %   Neutrophils Relative % 66 %   Neutro Abs 5.2 1.7 - 7.7 K/uL   Lymphocytes Relative 26 %   Lymphs Abs 2.1 0.7 - 4.0 K/uL   Monocytes Relative 7 %   Monocytes Absolute 0.6 0.1 - 1.0 K/uL   Eosinophils Relative 1 %   Eosinophils Absolute 0.1 0.0 - 0.5 K/uL   Basophils Relative 0 %   Basophils Absolute 0.0 0.0 - 0.1 K/uL   Immature Granulocytes 0 %   Abs Immature Granulocytes 0.03 0.00 - 0.07  K/uL  Comprehensive metabolic panel  Result Value Ref Range   Sodium 140 135 - 145 mmol/L   Potassium 4.2 3.5 - 5.1 mmol/L   Chloride 104 98 - 111 mmol/L   CO2 26 22 - 32 mmol/L   Glucose, Bld 89 70 - 99 mg/dL   BUN 18 6 - 20 mg/dL   Creatinine, Ser 1.61 0.61 - 1.24 mg/dL   Calcium 9.0 8.9 - 09.6 mg/dL   Total Protein 7.6 6.5 - 8.1 g/dL   Albumin 3.7 3.5 - 5.0 g/dL   AST 29 15 - 41 U/L   ALT 25 0 - 44 U/L   Alkaline Phosphatase 79 38 - 126 U/L   Total Bilirubin 0.2 (L) 0.3 - 1.2 mg/dL   GFR, Estimated >04 >54 mL/min   Anion gap 10 5 - 15  POC urinalysis dipstick  Result Value Ref Range   Color, UA yellow yellow   Clarity, UA clear clear   Glucose, UA negative negative mg/dL   Bilirubin, UA negative negative    Ketones, POC UA negative negative mg/dL   Spec Grav, UA 0.981 1.914 - 1.025   Blood, UA negative negative   pH, UA 6.0 5.0 - 8.0   Protein Ur, POC negative negative mg/dL   Urobilinogen, UA 0.2 0.2 or 1.0 E.U./dL   Nitrite, UA Negative Negative   Leukocytes, UA Negative Negative    Labs Reviewed  COMPREHENSIVE METABOLIC PANEL - Abnormal; Notable for the following components:      Result Value   Total Bilirubin 0.2 (*)    All other components within normal limits  CBC WITH DIFFERENTIAL/PLATELET  POCT URINALYSIS DIP (MANUAL ENTRY)    No Known Allergies  Past Medical History:  Diagnosis Date   Hypercholesteremia    History reviewed. No pertinent family history. Social History   Socioeconomic History   Marital status: Married    Spouse name: Not on file   Number of children: Not on file   Years of education: Not on file   Highest education level: Not on file  Occupational History   Not on file  Tobacco Use   Smoking status: Never   Smokeless tobacco: Never  Vaping Use   Vaping Use: Never used  Substance and Sexual Activity   Alcohol use: No   Drug use: No   Sexual activity: Yes  Other Topics Concern   Not on file  Social History Narrative   Not on file   Social Determinants of Health   Financial Resource Strain: Not on file  Food Insecurity: Not on file  Transportation Needs: Not on file  Physical Activity: Not on file  Stress: Not on file  Social Connections: Not on file  Intimate Partner Violence: Not on file           Mardella Layman, MD 05/12/23 [phone]
